# Patient Record
Sex: Female | Born: 1965 | Race: Black or African American | Hispanic: No | Smoking: Never smoker
Health system: Southern US, Community
[De-identification: ages and names within clinical notes are randomized; demographics above are authoritative.]

## PROBLEM LIST (undated history)

## (undated) DIAGNOSIS — R002 Palpitations: Secondary | ICD-10-CM

## (undated) DIAGNOSIS — N6019 Diffuse cystic mastopathy of unspecified breast: Secondary | ICD-10-CM

## (undated) DIAGNOSIS — R7301 Impaired fasting glucose: Secondary | ICD-10-CM

## (undated) DIAGNOSIS — R079 Chest pain, unspecified: Secondary | ICD-10-CM

## (undated) DIAGNOSIS — D72819 Decreased white blood cell count, unspecified: Secondary | ICD-10-CM

## (undated) DIAGNOSIS — E061 Subacute thyroiditis: Secondary | ICD-10-CM

## (undated) DIAGNOSIS — G609 Hereditary and idiopathic neuropathy, unspecified: Secondary | ICD-10-CM

## (undated) DIAGNOSIS — E0590 Thyrotoxicosis, unspecified without thyrotoxic crisis or storm: Secondary | ICD-10-CM

## (undated) DIAGNOSIS — H9319 Tinnitus, unspecified ear: Secondary | ICD-10-CM

## (undated) DIAGNOSIS — J309 Allergic rhinitis, unspecified: Secondary | ICD-10-CM

## (undated) DIAGNOSIS — N946 Dysmenorrhea, unspecified: Secondary | ICD-10-CM

## (undated) HISTORY — DX: Palpitations: R00.2

## (undated) HISTORY — PX: TUBAL LIGATION: SHX77

## (undated) HISTORY — DX: Impaired fasting glucose: R73.01

## (undated) HISTORY — DX: Hereditary and idiopathic neuropathy, unspecified: G60.9

## (undated) HISTORY — DX: Subacute thyroiditis: E06.1

## (undated) HISTORY — DX: Dysmenorrhea, unspecified: N94.6

## (undated) HISTORY — DX: Chest pain, unspecified: R07.9

## (undated) HISTORY — DX: Decreased white blood cell count, unspecified: D72.819

## (undated) HISTORY — DX: Thyrotoxicosis, unspecified without thyrotoxic crisis or storm: E05.90

## (undated) HISTORY — PX: TONSILLECTOMY: SUR1361

## (undated) HISTORY — DX: Diffuse cystic mastopathy of unspecified breast: N60.19

## (undated) HISTORY — DX: Tinnitus, unspecified ear: H93.19

## (undated) HISTORY — DX: Allergic rhinitis, unspecified: J30.9

---

## 1991-09-08 HISTORY — PX: BREAST EXCISIONAL BIOPSY: SUR124

## 2001-09-07 HISTORY — PX: BREAST EXCISIONAL BIOPSY: SUR124

## 2005-07-06 ENCOUNTER — Encounter (HOSPITAL_COMMUNITY): Admission: RE | Admit: 2005-07-06 | Discharge: 2005-10-04 | Payer: Self-pay | Admitting: Endocrinology

## 2006-03-31 ENCOUNTER — Emergency Department (HOSPITAL_COMMUNITY): Admission: EM | Admit: 2006-03-31 | Discharge: 2006-04-01 | Payer: Self-pay | Admitting: Emergency Medicine

## 2006-06-05 ENCOUNTER — Emergency Department (HOSPITAL_COMMUNITY): Admission: EM | Admit: 2006-06-05 | Discharge: 2006-06-06 | Payer: Self-pay | Admitting: Emergency Medicine

## 2006-06-07 ENCOUNTER — Ambulatory Visit (HOSPITAL_COMMUNITY): Admission: RE | Admit: 2006-06-07 | Discharge: 2006-06-07 | Payer: Self-pay | Admitting: Emergency Medicine

## 2006-06-07 ENCOUNTER — Encounter: Payer: Self-pay | Admitting: Vascular Surgery

## 2006-08-13 ENCOUNTER — Other Ambulatory Visit: Admission: RE | Admit: 2006-08-13 | Discharge: 2006-08-13 | Payer: Self-pay | Admitting: Obstetrics & Gynecology

## 2006-09-13 ENCOUNTER — Encounter: Admission: RE | Admit: 2006-09-13 | Discharge: 2006-09-13 | Payer: Self-pay | Admitting: Obstetrics & Gynecology

## 2007-06-21 ENCOUNTER — Encounter: Admission: RE | Admit: 2007-06-21 | Discharge: 2007-07-28 | Payer: Self-pay | Admitting: Family Medicine

## 2007-11-15 ENCOUNTER — Encounter: Admission: RE | Admit: 2007-11-15 | Discharge: 2007-11-15 | Payer: Self-pay | Admitting: Family Medicine

## 2008-03-13 ENCOUNTER — Emergency Department (HOSPITAL_COMMUNITY): Admission: EM | Admit: 2008-03-13 | Discharge: 2008-03-13 | Payer: Self-pay | Admitting: Emergency Medicine

## 2008-06-25 ENCOUNTER — Other Ambulatory Visit: Admission: RE | Admit: 2008-06-25 | Discharge: 2008-06-25 | Payer: Self-pay | Admitting: Obstetrics and Gynecology

## 2008-12-27 ENCOUNTER — Encounter: Payer: Self-pay | Admitting: Cardiology

## 2009-01-16 DIAGNOSIS — R079 Chest pain, unspecified: Secondary | ICD-10-CM | POA: Insufficient documentation

## 2009-01-17 ENCOUNTER — Encounter (INDEPENDENT_AMBULATORY_CARE_PROVIDER_SITE_OTHER): Payer: Self-pay | Admitting: *Deleted

## 2009-01-17 ENCOUNTER — Ambulatory Visit: Payer: Self-pay | Admitting: Cardiology

## 2009-01-18 ENCOUNTER — Ambulatory Visit: Payer: Self-pay | Admitting: Pulmonary Disease

## 2009-01-18 DIAGNOSIS — J309 Allergic rhinitis, unspecified: Secondary | ICD-10-CM | POA: Insufficient documentation

## 2009-10-11 ENCOUNTER — Encounter: Admission: RE | Admit: 2009-10-11 | Discharge: 2009-10-11 | Payer: Self-pay | Admitting: Obstetrics & Gynecology

## 2009-10-11 HISTORY — PX: BREAST BIOPSY: SHX20

## 2010-04-04 ENCOUNTER — Encounter: Admission: RE | Admit: 2010-04-04 | Discharge: 2010-04-04 | Payer: Self-pay | Admitting: Family Medicine

## 2010-04-07 ENCOUNTER — Encounter: Admission: RE | Admit: 2010-04-07 | Discharge: 2010-04-07 | Payer: Self-pay | Admitting: Internal Medicine

## 2010-08-04 ENCOUNTER — Encounter: Admission: RE | Admit: 2010-08-04 | Discharge: 2010-08-04 | Payer: Self-pay | Admitting: Obstetrics and Gynecology

## 2010-09-28 ENCOUNTER — Encounter: Payer: Self-pay | Admitting: Family Medicine

## 2011-04-09 ENCOUNTER — Other Ambulatory Visit: Payer: Self-pay | Admitting: Family Medicine

## 2011-04-09 DIAGNOSIS — R0789 Other chest pain: Secondary | ICD-10-CM

## 2011-04-13 ENCOUNTER — Ambulatory Visit
Admission: RE | Admit: 2011-04-13 | Discharge: 2011-04-13 | Disposition: A | Discharge status: Home / Self Care | Payer: 59 | Source: Ambulatory Visit | Attending: Family Medicine | Admitting: Family Medicine

## 2011-04-13 DIAGNOSIS — R0789 Other chest pain: Secondary | ICD-10-CM

## 2011-04-13 MED ORDER — IOHEXOL 300 MG/ML  SOLN
100.0000 mL | Freq: Once | INTRAMUSCULAR | Status: AC | PRN
  Administered 2011-04-13: 100 mL via INTRAVENOUS

## 2011-04-13 MED ORDER — DIPHENHYDRAMINE HCL 25 MG PO CAPS: 25.0000 mg | ORAL_CAPSULE | Freq: Once | ORAL | Status: DC

## 2011-04-13 NOTE — Progress Notes (Signed)
Suzanne Anderson is a 45 y.o. female patient. 1. Chest tightness   The patient gave a hx of mild urticarial reaction previously; and therefore she was given 25mg  of Benadryl po. No past medical history on file. No current outpatient prescriptions on file.   Current Facility-Administered Medications  Medication Dose Route Frequency Provider Last Rate Last Dose  . diphenhydrAMINE (BENADRYL) capsule 25 mg  25 mg Oral Once Juline Patch      . iohexol (OMNIPAQUE) 300 MG/ML injection 100 mL  100 mL Intravenous Once PRN Medication Radiologist   100 mL at 04/13/11 1023   Allergies not on file Active Problems:  * No active hospital problems. *   Last menstrual period 04/06/2011.  Subjective Objective Assessment & Plan  Juline Patch 04/13/2011

## 2011-04-14 ENCOUNTER — Ambulatory Visit (INDEPENDENT_AMBULATORY_CARE_PROVIDER_SITE_OTHER): Payer: 59 | Admitting: Internal Medicine

## 2011-04-14 DIAGNOSIS — R0609 Other forms of dyspnea: Secondary | ICD-10-CM

## 2011-04-14 LAB — PULMONARY FUNCTION TEST

## 2011-04-14 NOTE — Progress Notes (Signed)
PFT done today. 

## 2011-04-16 ENCOUNTER — Other Ambulatory Visit: Payer: Self-pay | Admitting: Internal Medicine

## 2011-04-22 ENCOUNTER — Encounter: Payer: Self-pay | Admitting: Family Medicine

## 2011-06-04 LAB — CBC
HCT: 39.1
Hemoglobin: 12.8
MCHC: 32.8
MCV: 81.5
Platelets: 231
RBC: 4.8
RDW: 14.4
WBC: 5.1

## 2011-06-04 LAB — COMPREHENSIVE METABOLIC PANEL
ALT: 18
AST: 25
Albumin: 4
Alkaline Phosphatase: 39
BUN: 8
CO2: 28
Calcium: 9.6
Chloride: 102
Creatinine, Ser: 0.7
GFR calc Af Amer: >60
GFR calc non Af Amer: >60
Glucose, Bld: 93
Potassium: 3.7
Sodium: 138
Total Bilirubin: 0.8
Total Protein: 7.3

## 2011-06-04 LAB — DIFFERENTIAL
Basophils Absolute: 0
Basophils Relative: 1
Eosinophils Absolute: 0.1
Eosinophils Relative: 2
Lymphocytes Relative: 35
Lymphs Abs: 1.8
Monocytes Absolute: 0.4
Monocytes Relative: 7
Neutro Abs: 2.8
Neutrophils Relative %: 55

## 2011-06-04 LAB — URINALYSIS, ROUTINE W REFLEX MICROSCOPIC
Glucose, UA: NEGATIVE
Protein, ur: NEGATIVE
Specific Gravity, Urine: 1.006
Urobilinogen, UA: 0.2

## 2011-06-04 LAB — POCT CARDIAC MARKERS
CKMB, poc: 1.8
Myoglobin, poc: 57.5
Operator id: 280141
Troponin i, poc: <0.05

## 2011-06-04 LAB — D-DIMER, QUANTITATIVE: D-Dimer, Quant: 0.36

## 2011-06-04 LAB — LIPASE, BLOOD: Lipase: 18

## 2011-08-25 ENCOUNTER — Other Ambulatory Visit: Payer: Self-pay | Admitting: Family Medicine

## 2011-08-25 DIAGNOSIS — Z1231 Encounter for screening mammogram for malignant neoplasm of breast: Secondary | ICD-10-CM

## 2011-09-18 ENCOUNTER — Ambulatory Visit: Payer: 59

## 2011-10-15 ENCOUNTER — Ambulatory Visit: Payer: 59

## 2011-11-13 ENCOUNTER — Ambulatory Visit
Admission: RE | Admit: 2011-11-13 | Discharge: 2011-11-13 | Disposition: A | Discharge status: Home / Self Care | Payer: 59 | Source: Ambulatory Visit | Attending: Family Medicine | Admitting: Family Medicine

## 2011-11-13 DIAGNOSIS — Z1231 Encounter for screening mammogram for malignant neoplasm of breast: Secondary | ICD-10-CM

## 2012-01-04 ENCOUNTER — Ambulatory Visit: Payer: 59 | Admitting: Nurse Practitioner

## 2012-02-17 ENCOUNTER — Encounter: Payer: Self-pay | Admitting: *Deleted

## 2012-02-29 ENCOUNTER — Encounter: Payer: 59 | Admitting: Cardiology

## 2012-10-27 ENCOUNTER — Other Ambulatory Visit (HOSPITAL_COMMUNITY): Payer: Self-pay | Admitting: Endocrinology

## 2012-10-27 DIAGNOSIS — E0590 Thyrotoxicosis, unspecified without thyrotoxic crisis or storm: Secondary | ICD-10-CM

## 2012-11-08 ENCOUNTER — Encounter (HOSPITAL_COMMUNITY)
Admission: RE | Admit: 2012-11-08 | Discharge: 2012-11-08 | Disposition: A | Discharge status: Home / Self Care | Payer: 59 | Source: Ambulatory Visit | Attending: Endocrinology | Admitting: Endocrinology

## 2012-11-08 DIAGNOSIS — E0590 Thyrotoxicosis, unspecified without thyrotoxic crisis or storm: Secondary | ICD-10-CM | POA: Insufficient documentation

## 2012-11-09 ENCOUNTER — Encounter (HOSPITAL_COMMUNITY): Payer: Self-pay

## 2012-11-09 ENCOUNTER — Encounter (HOSPITAL_COMMUNITY)
Admission: RE | Admit: 2012-11-09 | Discharge: 2012-11-09 | Disposition: A | Discharge status: Home / Self Care | Payer: 59 | Source: Ambulatory Visit | Attending: Endocrinology | Admitting: Endocrinology

## 2012-11-09 MED ORDER — SODIUM IODIDE I 131 CAPSULE
10.3000 | Freq: Once | INTRAVENOUS | Status: AC | PRN
  Administered 2012-11-09: 10.3 via ORAL

## 2012-11-09 MED ORDER — SODIUM PERTECHNETATE TC 99M INJECTION
11.0000 | Freq: Once | INTRAVENOUS | Status: AC | PRN
  Administered 2012-11-09: 11 via INTRAVENOUS

## 2013-07-14 ENCOUNTER — Other Ambulatory Visit: Payer: Self-pay

## 2013-07-14 DIAGNOSIS — Z1231 Encounter for screening mammogram for malignant neoplasm of breast: Secondary | ICD-10-CM

## 2013-08-07 ENCOUNTER — Other Ambulatory Visit (HOSPITAL_COMMUNITY)
Admission: RE | Admit: 2013-08-07 | Discharge: 2013-08-07 | Disposition: A | Discharge status: Home / Self Care | Payer: 59 | Source: Ambulatory Visit | Attending: Family Medicine | Admitting: Family Medicine

## 2013-08-07 ENCOUNTER — Other Ambulatory Visit: Payer: Self-pay | Admitting: Family Medicine

## 2013-08-07 DIAGNOSIS — Z01419 Encounter for gynecological examination (general) (routine) without abnormal findings: Secondary | ICD-10-CM | POA: Insufficient documentation

## 2013-08-15 ENCOUNTER — Ambulatory Visit: Payer: 59

## 2013-08-30 ENCOUNTER — Ambulatory Visit
Admission: RE | Admit: 2013-08-30 | Discharge: 2013-08-30 | Disposition: A | Discharge status: Home / Self Care | Payer: 59 | Source: Ambulatory Visit

## 2013-08-30 DIAGNOSIS — Z1231 Encounter for screening mammogram for malignant neoplasm of breast: Secondary | ICD-10-CM

## 2013-11-13 ENCOUNTER — Other Ambulatory Visit: Payer: Self-pay | Admitting: Family Medicine

## 2013-11-13 DIAGNOSIS — M549 Dorsalgia, unspecified: Secondary | ICD-10-CM

## 2013-11-14 ENCOUNTER — Ambulatory Visit
Admission: RE | Admit: 2013-11-14 | Discharge: 2013-11-14 | Disposition: A | Discharge status: Home / Self Care | Payer: 59 | Source: Ambulatory Visit | Attending: Family Medicine | Admitting: Family Medicine

## 2013-11-14 DIAGNOSIS — M549 Dorsalgia, unspecified: Secondary | ICD-10-CM

## 2013-11-17 ENCOUNTER — Emergency Department (HOSPITAL_COMMUNITY)
Admission: EM | Admit: 2013-11-17 | Discharge: 2013-11-17 | Disposition: A | Discharge status: Home / Self Care | Payer: 59 | Attending: Emergency Medicine | Admitting: Emergency Medicine

## 2013-11-17 ENCOUNTER — Encounter (HOSPITAL_COMMUNITY): Payer: Self-pay | Admitting: Emergency Medicine

## 2013-11-17 DIAGNOSIS — Z79899 Other long term (current) drug therapy: Secondary | ICD-10-CM | POA: Insufficient documentation

## 2013-11-17 DIAGNOSIS — Z862 Personal history of diseases of the blood and blood-forming organs and certain disorders involving the immune mechanism: Secondary | ICD-10-CM | POA: Insufficient documentation

## 2013-11-17 DIAGNOSIS — M545 Low back pain, unspecified: Secondary | ICD-10-CM

## 2013-11-17 DIAGNOSIS — M4310 Spondylolisthesis, site unspecified: Secondary | ICD-10-CM | POA: Insufficient documentation

## 2013-11-17 DIAGNOSIS — Z8742 Personal history of other diseases of the female genital tract: Secondary | ICD-10-CM | POA: Insufficient documentation

## 2013-11-17 DIAGNOSIS — Z8639 Personal history of other endocrine, nutritional and metabolic disease: Secondary | ICD-10-CM | POA: Insufficient documentation

## 2013-11-17 DIAGNOSIS — Z8669 Personal history of other diseases of the nervous system and sense organs: Secondary | ICD-10-CM | POA: Insufficient documentation

## 2013-11-17 LAB — CBC WITH DIFFERENTIAL/PLATELET
BASOS PCT: 0 % (ref 0–1)
Basophils Absolute: 0 10*3/uL (ref 0.0–0.1)
EOS ABS: 0.1 10*3/uL (ref 0.0–0.7)
EOS PCT: 1 % (ref 0–5)
HCT: 41.8 % (ref 36.0–46.0)
HEMOGLOBIN: 14 g/dL (ref 12.0–15.0)
Lymphocytes Relative: 9 % — ABNORMAL LOW (ref 12–46)
Lymphs Abs: 0.7 10*3/uL (ref 0.7–4.0)
MCH: 27 pg (ref 26.0–34.0)
MCHC: 33.5 g/dL (ref 30.0–36.0)
MCV: 80.7 fL (ref 78.0–100.0)
MONO ABS: 1.1 10*3/uL — AB (ref 0.1–1.0)
MONOS PCT: 14 % — AB (ref 3–12)
NEUTROS PCT: 77 % (ref 43–77)
Neutro Abs: 6.4 10*3/uL (ref 1.7–7.7)
Platelets: 236 10*3/uL (ref 150–400)
RBC: 5.18 MIL/uL — ABNORMAL HIGH (ref 3.87–5.11)
RDW: 15 % (ref 11.5–15.5)
WBC: 8.3 10*3/uL (ref 4.0–10.5)

## 2013-11-17 LAB — BASIC METABOLIC PANEL
BUN: 14 mg/dL (ref 6–23)
CALCIUM: 9.8 mg/dL (ref 8.4–10.5)
CO2: 23 mEq/L (ref 19–32)
CREATININE: 0.88 mg/dL (ref 0.50–1.10)
Chloride: 99 mEq/L (ref 96–112)
GFR, EST AFRICAN AMERICAN: 90 mL/min — AB (ref >90)
GFR, EST NON AFRICAN AMERICAN: 78 mL/min — AB (ref >90)
GLUCOSE: 112 mg/dL — AB (ref 70–99)
POTASSIUM: 4.5 meq/L (ref 3.7–5.3)
Sodium: 136 mEq/L — ABNORMAL LOW (ref 137–147)

## 2013-11-17 MED ORDER — ONDANSETRON 8 MG PO TBDP
8.0000 mg | ORAL_TABLET | Freq: Once | ORAL | Status: AC
  Administered 2013-11-17: 8 mg via ORAL
  Filled 2013-11-17: qty 1

## 2013-11-17 MED ORDER — HYDROMORPHONE HCL PF 1 MG/ML IJ SOLN
1.0000 mg | Freq: Once | INTRAMUSCULAR | Status: AC
  Administered 2013-11-17: 1 mg via INTRAVENOUS
  Filled 2013-11-17: qty 1

## 2013-11-17 MED ORDER — KETOROLAC TROMETHAMINE 15 MG/ML IJ SOLN
30.0000 mg | Freq: Once | INTRAMUSCULAR | Status: AC
  Administered 2013-11-17: 30 mg via INTRAVENOUS
  Filled 2013-11-17: qty 2

## 2013-11-17 MED ORDER — HYDROMORPHONE HCL 2 MG PO TABS
2.0000 mg | ORAL_TABLET | Freq: Once | ORAL | Status: AC
  Administered 2013-11-17: 2 mg via ORAL
  Filled 2013-11-17: qty 1

## 2013-11-17 MED ORDER — SODIUM CHLORIDE 0.9 % IV BOLUS (SEPSIS)
1000.0000 mL | Freq: Once | INTRAVENOUS | Status: AC
  Administered 2013-11-17: 1000 mL via INTRAVENOUS

## 2013-11-17 NOTE — ED Provider Notes (Signed)
CSN: [id number]     Arrival date & time 11/17/13  0212 History   First MD Initiated Contact with Patient 11/17/13 0421     Chief Complaint  Patient presents with  . Back Pain     (Consider location/radiation/quality/duration/timing/severity/associated sxs/prior Treatment) HPI Comments: Pt comes in with cc of back pain. Pain started in Jan, and she had a MRI - that showed lumbar disk protrusion. Pt started having flare up of her pain, which is left sided, and sharp again last night. Pain is worse with any movement. No associated numbness, weakness, urinary incontinence, urinary retention, bowel incontinence, saddle anesthesia.    Patient is a 48 y.o. female presenting with back pain. The history is provided by the patient.  Back Pain Associated symptoms: no fever, no numbness and no weakness     Past Medical History  Diagnosis Date  . Allergic rhinitis, cause unspecified   . Chest pain, unspecified   . Hyperthyroidism     reported as borderline by Dr. Vashti Hey  . Tinnitus   . Fibrocystic breast    Past Surgical History  Procedure Laterality Date  . Cesarean section    . Breast biopsy      for fibrocystic breast  . Tonsillectomy     History reviewed. No pertinent family history. History  Substance Use Topics  . Smoking status: Never Smoker   . Smokeless tobacco: Never Used  . Alcohol Use: No   OB History   Grav Para Term Preterm Abortions TAB SAB Ect Mult Living                 Review of Systems  Constitutional: Negative for fever and chills.  Gastrointestinal: Negative for nausea and vomiting.  Genitourinary: Negative for frequency, hematuria and flank pain.  Musculoskeletal: Positive for back pain.  Neurological: Negative for weakness and numbness.      Allergies  Terazol and Monistat  Home Medications   Current Outpatient Rx  Name  Route  Sig  Dispense  Refill  . albuterol (PROVENTIL HFA;VENTOLIN HFA) 108 (90 BASE) MCG/ACT inhaler   Inhalation  Inhale 1 puff into the lungs every 6 (six) hours as needed for wheezing or shortness of breath.         . cyclobenzaprine (FLEXERIL) 10 MG tablet   Oral   Take 10 mg by mouth 3 (three) times daily as needed for muscle spasms.         Marland Kitchen HYDROcodone-acetaminophen (NORCO/VICODIN) 5-325 MG per tablet   Oral   Take 1 tablet by mouth every 6 (six) hours as needed for moderate pain.         . montelukast (SINGULAIR) 10 MG tablet   Oral   Take 10 mg by mouth at bedtime.          BP 113/66  Pulse 87  Temp(Src) 98.9 F (37.2 C) (Oral)  Resp 18  Ht 5\' 4"  (1.626 m)  Wt 159 lb (72.122 kg)  BMI 27.28 kg/m2  SpO2 98%  LMP 11/01/2013 Physical Exam  Nursing note and vitals reviewed. Constitutional: She is oriented to person, place, and time. She appears well-developed and well-nourished.  HENT:  Head: Normocephalic and atraumatic.  Eyes: EOM are normal. Pupils are equal, round, and reactive to light.  Neck: Neck supple.  Cardiovascular: Normal rate, regular rhythm and normal heart sounds.   No murmur heard. Pulmonary/Chest: Effort normal. No respiratory distress.  Abdominal: Soft. She exhibits no distension. There is no tenderness. There is no rebound  and no guarding.  Musculoskeletal:  Pt has tenderness over the lumbar region No step offs, no erythema. Pt has 2+ patellar reflex bilaterally. Able to discriminate between sharp and dull.    Neurological: She is alert and oriented to person, place, and time.  Skin: Skin is warm and dry.    ED Course  Procedures (including critical care time) Labs Review Labs Reviewed  CBC WITH DIFFERENTIAL - Abnormal; Notable for the following:    RBC 5.18 (*)    Lymphocytes Relative 9 (*)    Monocytes Relative 14 (*)    Monocytes Absolute 1.1 (*)    All other components within normal limits  BASIC METABOLIC PANEL - Abnormal; Notable for the following:    Sodium 136 (*)    Glucose, Bld 112 (*)    GFR calc non Af Amer 78 (*)    GFR  calc Af Amer 90 (*)    All other components within normal limits  URINALYSIS, ROUTINE W REFLEX MICROSCOPIC  POC URINE PREG, ED   Imaging Review No results found.   EKG Interpretation None      MDM   Final diagnoses:  Lumbar spine pain  Spondylisthesis    Pt comes in with back pain. MRI done just 2-3 days ago, has some spine disc disease. Has Neurosurgery appt today. No cord compression issues. Pain improved with iv pain meds. Pt able to stand up and ambulate  -still uncomfortable. We decided to send her home, so that she Lucianne Lei get that important Nsurgery appt. Has pain meds at home. Return precautions discussed.   Varney Biles, MD 11/17/13 [phone]

## 2013-11-17 NOTE — Discharge Instructions (Signed)
Back Pain, Adult Low back pain is very common. About 1 in 5 people have back pain.The cause of low back pain is rarely dangerous. The pain often gets better over time.About half of people with a sudden onset of back pain feel better in just 2 weeks. About 8 in 10 people feel better by 6 weeks.  CAUSES Some common causes of back pain include:  Strain of the muscles or ligaments supporting the spine.  Wear and tear (degeneration) of the spinal discs.  Arthritis.  Direct injury to the back. DIAGNOSIS Most of the time, the direct cause of low back pain is not known.However, back pain can be treated effectively even when the exact cause of the pain is unknown.Answering your caregiver's questions about your overall health and symptoms is one of the most accurate ways to make sure the cause of your pain is not dangerous. If your caregiver needs more information, he or she may order lab work or imaging tests (X-rays or MRIs).However, even if imaging tests show changes in your back, this usually does not require surgery. HOME CARE INSTRUCTIONS For many people, back pain returns.Since low back pain is rarely dangerous, it is often a condition that people can learn to manageon their own.   Remain active. It is stressful on the back to sit or stand in one place. Do not sit, drive, or stand in one place for more than 30 minutes at a time. Take short walks on level surfaces as soon as pain allows.Try to increase the length of time you walk each day.  Do not stay in bed.Resting more than 1 or 2 days can delay your recovery.  Do not avoid exercise or work.Your body is made to move.It is not dangerous to be active, even though your back may hurt.Your back will likely heal faster if you return to being active before your pain is gone.  Pay attention to your body when you bend and lift. Many people have less discomfortwhen lifting if they bend their knees, keep the load close to their bodies,and  avoid twisting. Often, the most comfortable positions are those that put less stress on your recovering back.  Find a comfortable position to sleep. Use a firm mattress and lie on your side with your knees slightly bent. If you lie on your back, put a pillow under your knees.  Only take over-the-counter or prescription medicines as directed by your caregiver. Over-the-counter medicines to reduce pain and inflammation are often the most helpful.Your caregiver may prescribe muscle relaxant drugs.These medicines help dull your pain so you can more quickly return to your normal activities and healthy exercise.  Put ice on the injured area.  Put ice in a plastic bag.  Place a towel between your skin and the bag.  Leave the ice on for 15-20 minutes, 03-04 times a day for the first 2 to 3 days. After that, ice and heat may be alternated to reduce pain and spasms.  Ask your caregiver about trying back exercises and gentle massage. This may be of some benefit.  Avoid feeling anxious or stressed.Stress increases muscle tension and can worsen back pain.It is important to recognize when you are anxious or stressed and learn ways to manage it.Exercise is a great option. SEEK MEDICAL CARE IF:  You have pain that is not relieved with rest or medicine.  You have pain that does not improve in 1 week.  You have new symptoms.  You are generally not feeling well. SEEK   IMMEDIATE MEDICAL CARE IF:   You have pain that radiates from your back into your legs.  You develop new bowel or bladder control problems.  You have unusual weakness or numbness in your arms or legs.  You develop nausea or vomiting.  You develop abdominal pain.  You feel faint. Document Released: 08/24/2005 Document Revised: 02/23/2012 Document Reviewed: 01/12/2011 [employer] Patient Information 2014 [employer].  

## 2013-11-17 NOTE — ED Notes (Signed)
Patient states that she took only one dose of the flexeril. It was explained that she can take it 3 times daily as needed. Recently prescribed medications have not been taken consistently.

## 2013-11-17 NOTE — ED Notes (Signed)
Pt reports that she had worked out in January and began having lower back pain, pt had an MRI yesterday and was given medications for pain. Pt states that the medication has not helped, that the pain has caused her to be nauseated and faint earlier today.

## 2013-11-18 ENCOUNTER — Emergency Department (HOSPITAL_COMMUNITY): Payer: 59

## 2013-11-18 ENCOUNTER — Encounter (HOSPITAL_COMMUNITY): Payer: Self-pay | Admitting: Emergency Medicine

## 2013-11-18 ENCOUNTER — Emergency Department (HOSPITAL_COMMUNITY)
Admission: EM | Admit: 2013-11-18 | Discharge: 2013-11-18 | Disposition: A | Discharge status: Home / Self Care | Payer: 59 | Attending: Emergency Medicine | Admitting: Emergency Medicine

## 2013-11-18 DIAGNOSIS — R1031 Right lower quadrant pain: Secondary | ICD-10-CM | POA: Insufficient documentation

## 2013-11-18 DIAGNOSIS — Z8639 Personal history of other endocrine, nutritional and metabolic disease: Secondary | ICD-10-CM | POA: Insufficient documentation

## 2013-11-18 DIAGNOSIS — R110 Nausea: Secondary | ICD-10-CM | POA: Insufficient documentation

## 2013-11-18 DIAGNOSIS — Z8709 Personal history of other diseases of the respiratory system: Secondary | ICD-10-CM | POA: Insufficient documentation

## 2013-11-18 DIAGNOSIS — Z862 Personal history of diseases of the blood and blood-forming organs and certain disorders involving the immune mechanism: Secondary | ICD-10-CM | POA: Insufficient documentation

## 2013-11-18 DIAGNOSIS — M545 Low back pain, unspecified: Secondary | ICD-10-CM | POA: Insufficient documentation

## 2013-11-18 DIAGNOSIS — Z8669 Personal history of other diseases of the nervous system and sense organs: Secondary | ICD-10-CM | POA: Insufficient documentation

## 2013-11-18 DIAGNOSIS — R109 Unspecified abdominal pain: Secondary | ICD-10-CM

## 2013-11-18 DIAGNOSIS — R1032 Left lower quadrant pain: Secondary | ICD-10-CM | POA: Insufficient documentation

## 2013-11-18 DIAGNOSIS — Z8742 Personal history of other diseases of the female genital tract: Secondary | ICD-10-CM | POA: Insufficient documentation

## 2013-11-18 LAB — CBC WITH DIFFERENTIAL/PLATELET
BASOS ABS: 0 10*3/uL (ref 0.0–0.1)
BASOS PCT: 0 % (ref 0–1)
EOS ABS: 0.1 10*3/uL (ref 0.0–0.7)
Eosinophils Relative: 2 % (ref 0–5)
HCT: 40.6 % (ref 36.0–46.0)
Hemoglobin: 13.8 g/dL (ref 12.0–15.0)
Lymphocytes Relative: 20 % (ref 12–46)
Lymphs Abs: 1.4 10*3/uL (ref 0.7–4.0)
MCH: 27.4 pg (ref 26.0–34.0)
MCHC: 34 g/dL (ref 30.0–36.0)
MCV: 80.6 fL (ref 78.0–100.0)
Monocytes Absolute: 0.7 10*3/uL (ref 0.1–1.0)
Monocytes Relative: 10 % (ref 3–12)
NEUTROS ABS: 4.6 10*3/uL (ref 1.7–7.7)
NEUTROS PCT: 68 % (ref 43–77)
PLATELETS: 224 10*3/uL (ref 150–400)
RBC: 5.04 MIL/uL (ref 3.87–5.11)
RDW: 15.2 % (ref 11.5–15.5)
WBC: 6.8 10*3/uL (ref 4.0–10.5)

## 2013-11-18 LAB — WET PREP, GENITAL
CLUE CELLS WET PREP: NONE SEEN
TRICH WET PREP: NONE SEEN
Yeast Wet Prep HPF POC: NONE SEEN

## 2013-11-18 MED ORDER — HYDROCODONE-ACETAMINOPHEN 5-325 MG PO TABS: 1.0000 | ORAL_TABLET | Freq: Four times a day (QID) | ORAL | Status: DC | PRN

## 2013-11-18 MED ORDER — SODIUM CHLORIDE 0.9 % IV SOLN
Freq: Once | INTRAVENOUS | Status: AC
Start: 2013-11-18 — End: 2013-11-18
  Administered 2013-11-18: 10 mL/h via INTRAVENOUS

## 2013-11-18 MED ORDER — MORPHINE SULFATE 4 MG/ML IJ SOLN
4.0000 mg | Freq: Once | INTRAMUSCULAR | Status: AC
  Administered 2013-11-18: 4 mg via INTRAVENOUS
  Filled 2013-11-18: qty 1

## 2013-11-18 NOTE — ED Notes (Signed)
Pt comes from home where she lives with family with c/o lower abdominal pain radiating to flanks and low back.  Pt has been seen by PCP and orthopedist and had an MRI and xrays done.  None revealed cause of pain.  Pt endorses nausea, but denies vomiting and diarrhea.  Pt's last BM was Thursday past.  Pt's abdomen is mildly distended and not tender to palpation.  Pt referred here from [facility] walk-in clinic for abd/pelvic CT.

## 2013-11-18 NOTE — Discharge Instructions (Signed)
Abdominal Pain, Women °Abdominal (stomach, pelvic, or belly) pain can be caused by many things. It is important to tell your doctor: °· The location of the pain. °· Does it come and go or is it present all the time? °· Are there things that start the pain (eating certain foods, exercise)? °· Are there other symptoms associated with the pain (fever, nausea, vomiting, diarrhea)? °All of this is helpful to know when trying to find the cause of the pain. °CAUSES  °· Stomach: virus or bacteria infection, or ulcer. °· Intestine: appendicitis (inflamed appendix), regional ileitis (Crohn's disease), ulcerative colitis (inflamed colon), irritable bowel syndrome, diverticulitis (inflamed diverticulum of the colon), or cancer of the stomach or intestine. °· Gallbladder disease or stones in the gallbladder. °· Kidney disease, kidney stones, or infection. °· Pancreas infection or cancer. °· Fibromyalgia (pain disorder). °· Diseases of the female organs: °· Uterus: fibroid (non-cancerous) tumors or infection. °· Fallopian tubes: infection or tubal pregnancy. °· Ovary: cysts or tumors. °· Pelvic adhesions (scar tissue). °· Endometriosis (uterus lining tissue growing in the pelvis and on the pelvic organs). °· Pelvic congestion syndrome (female organs filling up with blood just before the menstrual period). °· Pain with the menstrual period. °· Pain with ovulation (producing an egg). °· Pain with an IUD (intrauterine device, birth control) in the uterus. °· Cancer of the female organs. °· Functional pain (pain not caused by a disease, may improve without treatment). °· Psychological pain. °· Depression. °DIAGNOSIS  °Your doctor will decide the seriousness of your pain by doing an examination. °· Blood tests. °· X-rays. °· Ultrasound. °· CT scan (computed tomography, special type of X-ray). °· MRI (magnetic resonance imaging). °· Cultures, for infection. °· Barium enema (dye inserted in the large intestine, to better view it with  X-rays). °· Colonoscopy (looking in intestine with a lighted tube). °· Laparoscopy (minor surgery, looking in abdomen with a lighted tube). °· Major abdominal exploratory surgery (looking in abdomen with a large incision). °TREATMENT  °The treatment will depend on the cause of the pain.  °· Many cases can be observed and treated at home. °· Over-the-counter medicines recommended by your caregiver. °· Prescription medicine. °· Antibiotics, for infection. °· Birth control pills, for painful periods or for ovulation pain. °· Hormone treatment, for endometriosis. °· Nerve blocking injections. °· Physical therapy. °· Antidepressants. °· Counseling with a psychologist or psychiatrist. °· Minor or major surgery. °HOME CARE INSTRUCTIONS  °· Do not take laxatives, unless directed by your caregiver. °· Take over-the-counter pain medicine only if ordered by your caregiver. Do not take aspirin because it can cause an upset stomach or bleeding. °· Try a clear liquid diet (broth or water) as ordered by your caregiver. Slowly move to a bland diet, as tolerated, if the pain is related to the stomach or intestine. °· Have a thermometer and take your temperature several times a day, and record it. °· Bed rest and sleep, if it helps the pain. °· Avoid sexual intercourse, if it causes pain. °· Avoid stressful situations. °· Keep your follow-up appointments and tests, as your caregiver orders. °· If the pain does not go away with medicine or surgery, you may try: °· Acupuncture. °· Relaxation exercises (yoga, meditation). °· Group therapy. °· Counseling. °SEEK MEDICAL CARE IF:  °· You notice certain foods cause stomach pain. °· Your home care treatment is not helping your pain. °· You need stronger pain medicine. °· You want your IUD removed. °· You feel faint or   lightheaded. °· You develop nausea and vomiting. °· You develop a rash. °· You are having side effects or an allergy to your medicine. °SEEK IMMEDIATE MEDICAL CARE IF:  °· Your  pain does not go away or gets worse. °· You have a fever. °· Your pain is felt only in portions of the abdomen. The right side could possibly be appendicitis. The left lower portion of the abdomen could be colitis or diverticulitis. °· You are passing blood in your stools (bright red or black tarry stools, with or without vomiting). °· You have blood in your urine. °· You develop chills, with or without a fever. °· You pass out. °MAKE SURE YOU:  °· Understand these instructions. °· Will watch your condition. °· Will get help right away if you are not doing well or get worse. °Document Released: 06/21/2007 Document Revised: 11/16/2011 Document Reviewed: 07/11/2009 °[employer]® Patient Information ©2014 [employer]. ° °

## 2013-11-18 NOTE — ED Provider Notes (Signed)
CSN: [id number]     Arrival date & time 11/18/13  1116 History   First MD Initiated Contact with Patient 11/18/13 1121     Chief Complaint  Patient presents with  . Abdominal Pain  . Flank Pain     (Consider location/radiation/quality/duration/timing/severity/associated sxs/prior Treatment) Patient is a 48 y.o. female presenting with abdominal pain and flank pain. The history is provided by the patient. No language interpreter was used.  Abdominal Pain Pain location:  LLQ and RLQ Associated symptoms: nausea   Associated symptoms: no chest pain, no constipation, no dysuria, no fever, no shortness of breath, no vaginal bleeding, no vaginal discharge and no vomiting   Associated symptoms comment:  She returns to the ED after being seen yesterday for lower back pain. She reports this is an ongoing issue that has been evaluated with MRI and orthopedic consult that ruled out a radicular cause of pain. She states the pain in the lower left back radiates around to the abdomen and across to the right side and is more intense today that previously. She has had nausea without vomiting. She reports regular and daily bowel movements. No urinary symptoms, vaginal discharge. She has a normal PAP in January of this year.  Flank Pain Associated symptoms include abdominal pain and nausea. Pertinent negatives include no chest pain, fever or vomiting.    Past Medical History  Diagnosis Date  . Allergic rhinitis, cause unspecified   . Chest pain, unspecified   . Hyperthyroidism     reported as borderline by Dr. Vashti Hey  . Tinnitus   . Fibrocystic breast    Past Surgical History  Procedure Laterality Date  . Cesarean section    . Breast biopsy      for fibrocystic breast  . Tonsillectomy     No family history on file. History  Substance Use Topics  . Smoking status: Never Smoker   . Smokeless tobacco: Never Used  . Alcohol Use: No   OB History   Grav Para Term Preterm Abortions TAB SAB  Ect Mult Living                 Review of Systems  Constitutional: Negative for fever.  Respiratory: Negative for shortness of breath.   Cardiovascular: Negative for chest pain.  Gastrointestinal: Positive for nausea and abdominal pain. Negative for vomiting and constipation.  Genitourinary: Positive for flank pain. Negative for dysuria, vaginal bleeding and vaginal discharge.  Musculoskeletal: Positive for back pain.      Allergies  Peanuts; Terazol; and Monistat  Home Medications   Current Outpatient Rx  Name  Route  Sig  Dispense  Refill  . albuterol (PROVENTIL HFA;VENTOLIN HFA) 108 (90 BASE) MCG/ACT inhaler   Inhalation   Inhale 1 puff into the lungs every 6 (six) hours as needed for wheezing or shortness of breath.         . cyclobenzaprine (FLEXERIL) 10 MG tablet   Oral   Take 10 mg by mouth 3 (three) times daily as needed for muscle spasms.         Marland Kitchen HYDROcodone-acetaminophen (NORCO/VICODIN) 5-325 MG per tablet   Oral   Take 1 tablet by mouth every 6 (six) hours as needed for moderate pain.         . montelukast (SINGULAIR) 10 MG tablet   Oral   Take 10 mg by mouth at bedtime.          BP 114/74  Pulse 100  Temp(Src) 98.3 F (36.8  C) (Oral)  Resp 12  SpO2 97%  LMP 10/29/2013 Physical Exam  Constitutional: She is oriented to person, place, and time. She appears well-developed and well-nourished.  HENT:  Head: Normocephalic.  Neck: Normal range of motion. Neck supple.  Cardiovascular: Normal rate and regular rhythm.   Pulmonary/Chest: Effort normal and breath sounds normal.  Abdominal: Soft. Bowel sounds are normal. There is tenderness. There is no rebound and no guarding.  Tender across lower abdomen without guarding.   Genitourinary: No vaginal discharge found.  No adnexal tenderness or mass. No CMT.  Musculoskeletal: Normal range of motion.  Neurological: She is alert and oriented to person, place, and time.  Skin: Skin is warm and dry. No  rash noted.  Psychiatric: She has a normal mood and affect.    ED Course  Procedures (including critical care time) Labs Review Labs Reviewed  WET PREP, GENITAL  GC/CHLAMYDIA PROBE AMP  CBC WITH DIFFERENTIAL   Imaging Review Ct Abdomen Pelvis W Contrast  11/18/2013   CLINICAL DATA:  Abdominal pain and flank pain.  EXAM: CT ABDOMEN AND PELVIS WITHOUT CONTRAST  TECHNIQUE: Multidetector CT imaging of the abdomen and pelvis was performed without intravenous contrast.  CONTRAST:  None  COMPARISON:  MRI lumbar spine 11/14/2013  FINDINGS: The imaged lung bases are clear. Slight elevation the left hemidiaphragm. Both kidneys are normal in size and contour. Incomplete rotation of the left kidney, an anatomic variant. Negative for renal stones or hydronephrosis. No evidence of ureteral dilatation. No ureteral stones are identified. There are bilateral pelvic phleboliths. The urinary bladder has a normal appearance. The noncontrast appearance of the liver, gallbladder, spleen, adrenal glands, and pancreas is normal. The stomach is decompressed. Small bowel loops are normal in caliber. There is a moderate amount of stool in the colon. No evidence of colonic wall thickening. No mesenteric inflammatory changes are identified.  No lymphadenopathy is detected on noncontrast imaging. Negative for ascites or free intraperitoneal air. Uterus is retroflexed. No adnexal mass identified. No acute or suspicious bony abnormality.  IMPRESSION: 1. Negative for urinary tract stone disease or obstruction. 2. Moderate amount of stool in the colon. 3. Retroflexed uterus.   Electronically Signed   By: Curlene Dolphin M.D.   On: 11/18/2013 13:06     EKG Interpretation None      MDM   Final diagnoses:  None    1. Abdominal pain  Patient with persistent, worsening abdominal pain predominantly in LLQ, with negative CT scan, non-tender pelvic exam, no leukocytosis with symptoms for 2 weeks. She is stable for discharge and  further work up outpatient. Return precautions given.     Dewaine Oats, PA-C 11/18/13 1453

## 2013-11-18 NOTE — ED Provider Notes (Signed)
Medical screening examination/treatment/procedure(s) were performed by non-physician practitioner and as supervising physician I was immediately available for consultation/collaboration.  Kathalene Frames, MD 11/18/13 1455

## 2013-11-18 NOTE — ED Notes (Signed)
Pt ambulated to bathroom 

## 2013-11-18 NOTE — ED Notes (Signed)
Patient transported to and returned from CT.

## 2013-11-18 NOTE — ED Notes (Signed)
Pt's bowels sounds are diminished - pt states she has not been eating because she has been so busy going to various medical practices.  She denies anorexia.  Pt's abdomen is mildly distended, but not painful to palpation.  Pt states pain increases with movement.  Pt has good peripheral pulses and lung sounds are clear.  Pt denies vomiting, but endorses nausea.  Pt denies pain with urination or hematuria, but endorses moderate increase in urination.  Pt's last BM was Thursday and pt denies blood in stool or exceptionally dark stool.

## 2013-11-20 LAB — GC/CHLAMYDIA PROBE AMP
CT Probe RNA: NEGATIVE
GC PROBE AMP APTIMA: NEGATIVE

## 2013-11-22 ENCOUNTER — Other Ambulatory Visit: Payer: Self-pay | Admitting: Gastroenterology

## 2013-11-22 DIAGNOSIS — R1032 Left lower quadrant pain: Secondary | ICD-10-CM

## 2013-11-29 ENCOUNTER — Ambulatory Visit
Admission: RE | Admit: 2013-11-29 | Discharge: 2013-11-29 | Disposition: A | Discharge status: Home / Self Care | Payer: 59 | Source: Ambulatory Visit | Attending: Gastroenterology | Admitting: Gastroenterology

## 2013-11-29 DIAGNOSIS — R1032 Left lower quadrant pain: Secondary | ICD-10-CM

## 2014-01-22 ENCOUNTER — Encounter: Payer: Self-pay | Admitting: Certified Nurse Midwife

## 2014-02-20 ENCOUNTER — Encounter: Payer: Self-pay | Admitting: Certified Nurse Midwife

## 2014-02-20 ENCOUNTER — Ambulatory Visit (INDEPENDENT_AMBULATORY_CARE_PROVIDER_SITE_OTHER): Payer: 59 | Admitting: Certified Nurse Midwife

## 2014-02-20 VITALS — BP 110/70 | HR 72 | Resp 16 | Ht 65.25 in | Wt 149.0 lb

## 2014-02-20 DIAGNOSIS — Z0000 Encounter for general adult medical examination without abnormal findings: Secondary | ICD-10-CM

## 2014-02-20 DIAGNOSIS — N898 Other specified noninflammatory disorders of vagina: Secondary | ICD-10-CM

## 2014-02-20 LAB — POCT URINALYSIS DIPSTICK
Bilirubin, UA: NEGATIVE
Blood, UA: NEGATIVE
Glucose, UA: NEGATIVE
Ketones, UA: NEGATIVE
LEUKOCYTES UA: NEGATIVE
Nitrite, UA: NEGATIVE
PH UA: 5
Protein, UA: NEGATIVE
UROBILINOGEN UA: NEGATIVE

## 2014-02-20 NOTE — Progress Notes (Signed)
48 y.o. [id number] Married African American Fe here to re- establish gyn care. Patient was told to establish care, but does not need  annual exam. Patient has been seeing PCP Dr. Drema Dallas and had a pap smear this year. Patient is being seen by endocrine for management of Hyperthyroidism, no medication at this point. Patient also is being followed  With GI. Patient complaining of vaginal odor and would like it checked. No new personal products or pain sexual activity. Patient would like a pelvic exam to know everything is normal. She occasionally notes cramping in pelvic area and was told it is related to her GI issues. No other health issues today.  Patient's last menstrual period was 02/12/2014.          Sexually active: yes  The current method of family planning is tubal ligation.    Exercising: yes  occ Smoker:  no  Health Maintenance: Pap:  2015 normal per patient MMG:  08-30-13 normal Colonoscopy: barium enema 3/15 normal BMD:   none TDaP:not sure of date Labs: Poct urine-neg Self breast exam: not done   reports that she has never smoked. She has never used smokeless tobacco. She reports that she does not drink alcohol or use illicit drugs.  Past Medical History  Diagnosis Date  . Allergic rhinitis, cause unspecified   . Chest pain, unspecified   . Hyperthyroidism     reported as borderline by Dr. Vashti Hey  . Tinnitus   . Fibrocystic breast   . Dysmenorrhea   . Palpitations     Past Surgical History  Procedure Laterality Date  . Tonsillectomy    . Tubal ligation    . Cesarean section      times 2  . Breast biopsy      for fibrocystic breast times 2    Current Outpatient Prescriptions  Medication Sig Dispense Refill  . albuterol (PROVENTIL HFA;VENTOLIN HFA) 108 (90 BASE) MCG/ACT inhaler Inhale 1 puff into the lungs every 6 (six) hours as needed for wheezing or shortness of breath.      . cetirizine (ZYRTEC) 10 MG tablet Take 5 mg by mouth daily.      . montelukast  (SINGULAIR) 10 MG tablet Take 10 mg by mouth at bedtime.      . Multiple Vitamins-Minerals (MULTIVITAMIN PO) Take by mouth as needed.       No current facility-administered medications for this visit.    Family History  Problem Relation Age of Onset  . Heart murmur Sister   . Heart murmur Maternal Aunt   . Diabetes Maternal Grandmother   . Hypertension Maternal Grandmother   . Stroke Maternal Grandmother   . Heart attack Paternal Grandfather   . Heart murmur Maternal Aunt   . Heart murmur Daughter     ROS:  Pertinent items are noted in HPI.  Otherwise, a comprehensive ROS was negative.  Exam:   BP 110/70  Pulse 72  Resp 16  Ht 5' 5.25" (1.657 m)  Wt 149 lb (67.586 kg)  BMI 24.62 kg/m2  LMP 02/12/2014 Height: 5' 5.25" (165.7 cm)  Ht Readings from Last 3 Encounters:  02/20/14 5' 5.25" (1.657 m)  11/17/13 5\' 4"  (1.626 m)  01/18/09 5\' 4"  (1.626 m)    General appearance: alert, cooperative and appears stated age Head: Normocephalic, without obvious abnormality, atraumatic Abdomen: soft, non-tender; no masses,  no organomegaly Extremities: extremities normal, atraumatic, no cyanosis or edema Skin: Skin color, texture, turgor normal. No rashes or lesions No abnormal  inguinal nodes palpated Neurologic: Grossly normal   Pelvic: External genitalia:  no lesions              Urethra:  normal appearing urethra with no masses, tenderness or lesions              Bartholin's and Skene's: normal                 Vagina: normal appearing vagina with normal color and discharge, no lesions, wet prep taken ph 3.5              Cervix: normal, non tender              Pap taken: no Bimanual Exam:  Uterus:  normal size, contour, position, consistency, mobility, non-tender and anteverted              Adnexa: normal adnexa and no mass, fullness, tenderness           A: Normal pelvic exam   Normal vaginal discharge with negative wet prep    Patient under follow up for colon issues with GI    Aex due 2016   P:  Reviewed findings of normal pelvic exam.        Reviewed findings of negative wet prep and vaginal discharge has normal appearance, reassured. Questions addressed. Patient will be in [place] for several months now, recent trip to [place] for relatives funeral.    return annually or prn  An After Visit Summary was printed and given to the patient.

## 2014-02-20 NOTE — Patient Instructions (Addendum)

## 2014-05-02 ENCOUNTER — Encounter: Payer: Self-pay | Admitting: *Deleted

## 2014-05-03 ENCOUNTER — Encounter: Payer: Self-pay | Admitting: Neurology

## 2014-05-03 ENCOUNTER — Ambulatory Visit (INDEPENDENT_AMBULATORY_CARE_PROVIDER_SITE_OTHER): Payer: 59 | Admitting: Neurology

## 2014-05-03 VITALS — BP 100/66 | HR 75 | Ht 66.5 in | Wt 148.8 lb

## 2014-05-03 DIAGNOSIS — M5416 Radiculopathy, lumbar region: Secondary | ICD-10-CM

## 2014-05-03 DIAGNOSIS — G43709 Chronic migraine without aura, not intractable, without status migrainosus: Secondary | ICD-10-CM

## 2014-05-03 DIAGNOSIS — IMO0002 Reserved for concepts with insufficient information to code with codable children: Secondary | ICD-10-CM

## 2014-05-03 DIAGNOSIS — G629 Polyneuropathy, unspecified: Secondary | ICD-10-CM

## 2014-05-03 DIAGNOSIS — G609 Hereditary and idiopathic neuropathy, unspecified: Secondary | ICD-10-CM

## 2014-05-03 NOTE — Patient Instructions (Signed)
Overall you are doing fairly well but I do want to suggest a few things today:   Remember to drink plenty of fluid, eat healthy meals and do not skip any meals. Try to eat protein with a every meal and eat a healthy snack such as fruit or nuts in between meals. Try to keep a regular sleep-wake schedule and try to exercise daily, particularly in the form of walking, 20-30 minutes a day, if you can.   As far as diagnostic testing: bloodwork, emg/ncs and botox  I would like to see you back in 3 montha, sooner if we need to. Please call us with any interim questions, concerns, problems, updates or refill requests.   Please also call us for any test results so we can go over those with you on the phone.  My clinical assistant and will answer any of your questions and relay your messages to me and also relay most of my messages to you.   Our phone number is [phone]. We also have an after hours call service for urgent matters and there is a physician on-call for urgent questions. For any emergencies you know to call 911 or go to the nearest emergency room

## 2014-05-03 NOTE — Progress Notes (Addendum)
[facility]    Provider:  Dr Jaynee Eagles Referring Provider: Randa Lynn* Primary Care Physician:  Gerrit Heck, MD  CC:  Peripheral Neuropathy  HPI:  Suzanne Anderson is a 48 y.o. female PMHx of hyperhtyroidism, diabetes and headaches who is here as a referral from Dr. Drema Dallas for Peripheral Neuropathy.  She describes burning in the bottom of the feet, fingers and palms. Started in the bottom of the feet. Started last year. Worsening, progressing. Worse at night especially the palms. Better with drinking water. At night can't have the sheets touch the hands. Not really painful just uncomfortable and interrupts her doing things like opening bottles of water. The heaaches are daily,  Pressure in the temples. The migraines can't funtion, pounding 10/10, light sensitivity, phonophobia, onemonthly and she always gets them with her period. Perfume triggers it. Triptan helps but takes it when severe. Has sparkly lights, flashes before then and gets "befuzzled". Last hours to days. Learning to handle it better with diet. The daily pressure headaches get to about a 5-6/10 and takes naps to help, no OTC medications. Sleeps "ok" but the headaches wake her up. Both types started in 2010. Grandmother had migraines and used to pour alcohol on her head to try and make it better. First had migraines at the age of 18. Has had status migrainosus.   Denies muscle cramps. Balance is fine. No falls. Denies weakness. Has low back pain that radiates down the lateral part of the leg, like a shock. Unclear when the shocks happens, has soreness there all the time. Also has non-stop ringing in the ear, evaluation by ENT was negative. No focal neuro symptoms with headaches.   Reviewed notes, labs and imaging from outside physicians, which showed HgA1c 5.8, eGFR 100, B12 1648.   Review of Systems: Patient complains of symptoms per HPI as well as the following symptoms  Light  sensitivity, headache, ear ringing, back pain, itching. Pertinent negatives per HPI. Otherwise out of a complete 14 system review, and all other reviewed systems are negative.   History   Social History  . Marital Status: Married    Spouse Name: N/A    Number of Children: 2  . Years of Education: N/A   Occupational History  . Stay at home mom    Social History Main Topics  . Smoking status: Never Smoker   . Smokeless tobacco: Never Used  . Alcohol Use: No  . Drug Use: No  . Sexual Activity: Yes    Partners: Male    Birth Control/ Protection: Surgical     Comment: tubal ligation   Other Topics Concern  . Not on file   Social History Narrative  . No narrative on file    Family History  Problem Relation Age of Onset  . Heart murmur Sister   . Heart murmur Maternal Aunt   . Diabetes Maternal Grandmother   . Hypertension Maternal Grandmother   . Stroke Maternal Grandmother   . Heart attack Paternal Grandfather   . Heart murmur Maternal Aunt   . Heart murmur Daughter     Past Medical History  Diagnosis Date  . Allergic rhinitis, cause unspecified   . Chest pain, unspecified   . Hyperthyroidism     reported as borderline by Dr. Vashti Hey  . Tinnitus   . Fibrocystic breast   . Dysmenorrhea   . Palpitations   . Subacute thyroiditis   . Impaired fasting glucose   . Leukocytopenia, unspecified   .  Unspecified hereditary and idiopathic peripheral neuropathy     Past Surgical History  Procedure Laterality Date  . Tonsillectomy    . Tubal ligation    . Cesarean section      times 2  . Breast biopsy      for fibrocystic breast times 2    Current Outpatient Prescriptions  Medication Sig Dispense Refill  . albuterol (PROVENTIL HFA;VENTOLIN HFA) 108 (90 BASE) MCG/ACT inhaler Inhale 1 puff into the lungs every 6 (six) hours as needed for wheezing or shortness of breath.      . Calcium Acetate, Phos Binder, (CALCIUM ACETATE PO) Take by mouth.      . cetirizine  (ZYRTEC) 10 MG tablet Take 5 mg by mouth daily.      . montelukast (SINGULAIR) 10 MG tablet Take 10 mg by mouth at bedtime.      . Multiple Vitamins-Minerals (MULTIVITAMIN PO) Take by mouth as needed.      . Omega-3 Fatty Acids (FISH OIL) 1200 MG CPDR Take 1,200 mg by mouth daily.      . Probiotic Product (PROBIOTIC DAILY PO) Take by mouth.      . SUMAtriptan (IMITREX) 100 MG tablet Take 100 mg by mouth every 2 (two) hours as needed for migraine or headache. May repeat in 2 hours if headache persists or recurs.       No current facility-administered medications for this visit.    Allergies as of 05/03/2014 - Review Complete 02/20/2014  Allergen Reaction Noted  . Flexeril [cyclobenzaprine]  02/20/2014  . Peanuts [peanut oil] Nausea And Vomiting 11/18/2013  . Terazol [terconazole] Swelling   . Vicodin [hydrocodone-acetaminophen]  02/20/2014  . Monistat [miconazole] Rash     Vitals: There were no vitals taken for this visit. Last Weight:  Wt Readings from Last 1 Encounters:  02/20/14 149 lb (67.586 kg)   Last Height:   Ht Readings from Last 1 Encounters:  02/20/14 5' 5.25" (1.657 m)     Physical exam: Exam: Gen: NAD, conversant Eyes: anicteric sclerae, moist conjunctivae HENT: Atraumatic, oropharynx clear Neck: Trachea midline; supple,  Lungs: CTA, no wheezing, rales, rhonic                          CV: RRR, no MRG Abdomen: Soft, non-tender;  Extremities: No peripheral edema  Skin: Normal temperature, no rash,  Psych: Appropriate affect, pleasant  Neuro: Detailed Neurologic Exam  Speech:    Speech is normal; fluent and spontaneous with normal comprehension.  Cognition:    The patient is oriented to person, place, and time; memory intact; language fluent; normal attention, concentration, and fund of knowledge.  Cranial Nerves:    The pupils are equal, round, and reactive to light. The fundi are normal and spontaneous venous pulsations are present. Visual fields are  full to finger confrontation. Extraocular movements are intact. Trigeminal sensation is intact and the muscles of mastication are normal. The face is symmetric. The palate elevates in the midline. Voice is normal. Shoulder shrug is normal. The tongue has normal motion without fasciculations.   Coordination:    Normal finger to nose and heel to shin. Normal rapid alternating movements.   Gait:    Heel-toe and tandem gait are normal.   Motor Observation:    No asymmetry, no atrophy, and no involuntary movements noted.  Tone:    Normal muscle tone.   Posture:    Posture is normal. normal erect    Strength:  Strength is V/V in the upper and lower limbs.            Vibratory Sensation:    Normal vibratory sensation in upper and lower extremities.    Light Touch:    Normal light touch sensation in upper and lower extremities.  Proprioception:    Normal proprioception in upper and lower extremities.   Pin Prick:    Normal sensation to pinprick in upper and lower extremities.  Temperature:    Normal temperature sensation in upper and lower extremities.  Reflex Exam:  DTR's:    Deep tendon reflexes in the upper and lower extremities are normal bilaterally.   Toes:    The toes are downgoing bilaterally.    Assessment:  48 year old female with glucose intolerance and a PMHx of migraines who is here for evaluation of burning in the feet and hands. She also has chronic migraines and lumbar radiculopathy. Will order a serum neuropathy screen and further eval radiculopathy and neuropathy with emg/ncs. Recommended botox of patient, provided information to her and discussed.  A total of 60 minutes was spent in with this patient. Over half this time was spent on counseling patient on the diagnosis and different therapeutic options available.    Sarina Ill, MD  [facility] [address] [place], [place] [address]  Phone [phone] Fax  [phone] Lenor Coffin

## 2014-05-06 ENCOUNTER — Encounter: Payer: Self-pay | Admitting: Neurology

## 2014-05-06 DIAGNOSIS — IMO0002 Reserved for concepts with insufficient information to code with codable children: Secondary | ICD-10-CM | POA: Insufficient documentation

## 2014-05-06 DIAGNOSIS — G43709 Chronic migraine without aura, not intractable, without status migrainosus: Secondary | ICD-10-CM | POA: Insufficient documentation

## 2014-05-06 DIAGNOSIS — M5416 Radiculopathy, lumbar region: Secondary | ICD-10-CM | POA: Insufficient documentation

## 2014-05-06 DIAGNOSIS — G629 Polyneuropathy, unspecified: Secondary | ICD-10-CM | POA: Insufficient documentation

## 2014-05-08 LAB — IFE AND PE, SERUM
ALBUMIN SERPL ELPH-MCNC: 4.1 g/dL (ref 3.2–5.6)
Albumin/Glob SerPl: 1.3 (ref 0.7–2.0)
Alpha 1: 0.2 g/dL (ref 0.1–0.4)
Alpha2 Glob SerPl Elph-Mcnc: 0.6 g/dL (ref 0.4–1.2)
B-Globulin SerPl Elph-Mcnc: 0.9 g/dL (ref 0.6–1.3)
Gamma Glob SerPl Elph-Mcnc: 1.6 g/dL (ref 0.5–1.6)
Globulin, Total: 3.3 g/dL (ref 2.0–4.5)
IGA/IMMUNOGLOBULIN A, SERUM: 197 mg/dL (ref 91–414)
IGG (IMMUNOGLOBIN G), SERUM: 1487 mg/dL (ref 700–1600)
IGM (IMMUNOGLOBULIN M), SRM: 106 mg/dL (ref 40–230)
Total Protein: 7.4 g/dL (ref 6.0–8.5)

## 2014-05-08 LAB — CRYOGLOBULIN, QL, SERUM, RFLX

## 2014-05-08 LAB — RHEUMATOID FACTOR: Rhuematoid fact SerPl-aCnc: 9.4 IU/mL (ref 0.0–13.9)

## 2014-05-08 LAB — LYME, TOTAL AB TEST/REFLEX: Lyme IgG/IgM Ab: <0.91 {ISR} (ref 0.00–0.90)

## 2014-05-08 LAB — PAN-ANCA
Atypical pANCA: <1:20 {titer}
C-ANCA: <1:20 {titer}
Myeloperoxidase Ab: <9 U/mL (ref 0.0–9.0)
P-ANCA: <1:20 {titer}

## 2014-05-08 LAB — ANA W/REFLEX: Anti Nuclear Antibody(ANA): NEGATIVE

## 2014-05-08 LAB — HIV ANTIBODY (ROUTINE TESTING W REFLEX)
HIV 1/O/2 Abs-Index Value: <1 (ref <1.00)
HIV-1/HIV-2 Ab: NONREACTIVE

## 2014-05-08 LAB — SEDIMENTATION RATE: Sed Rate: 7 mm/hr (ref 0–32)

## 2014-05-08 LAB — ANGIOTENSIN CONVERTING ENZYME: ANGIO CONVERT ENZYME: 21 U/L (ref 14–82)

## 2014-05-10 ENCOUNTER — Ambulatory Visit (INDEPENDENT_AMBULATORY_CARE_PROVIDER_SITE_OTHER): Payer: 59 | Admitting: Neurology

## 2014-05-10 ENCOUNTER — Ambulatory Visit (INDEPENDENT_AMBULATORY_CARE_PROVIDER_SITE_OTHER): Payer: Self-pay | Admitting: *Deleted

## 2014-05-10 ENCOUNTER — Encounter (INDEPENDENT_AMBULATORY_CARE_PROVIDER_SITE_OTHER): Payer: Self-pay

## 2014-05-10 DIAGNOSIS — G629 Polyneuropathy, unspecified: Secondary | ICD-10-CM

## 2014-05-10 DIAGNOSIS — M5416 Radiculopathy, lumbar region: Secondary | ICD-10-CM

## 2014-05-10 DIAGNOSIS — Z0289 Encounter for other administrative examinations: Secondary | ICD-10-CM

## 2014-05-10 DIAGNOSIS — G588 Other specified mononeuropathies: Secondary | ICD-10-CM

## 2014-05-10 DIAGNOSIS — G43709 Chronic migraine without aura, not intractable, without status migrainosus: Secondary | ICD-10-CM

## 2014-05-10 DIAGNOSIS — G609 Hereditary and idiopathic neuropathy, unspecified: Secondary | ICD-10-CM

## 2014-05-10 MED ORDER — KETOROLAC TROMETHAMINE 30 MG/ML IJ SOLN
30.0000 mg | Freq: Once | INTRAMUSCULAR | Status: AC
  Administered 2014-05-10: 30 mg via INTRAMUSCULAR

## 2014-05-10 NOTE — Patient Instructions (Signed)
Pt to check out.  Will go home and rest/quiet/darkness and call back as needed.

## 2014-05-10 NOTE — Progress Notes (Signed)
Order for toradol 30mg  IM for migraine triggered by [place]/EMG testing done today by Dr. Jaynee Eagles.  Instructed that would give injection for migraine pain. Level 6  (toradol 30mg  IM).  Under aseptic technique toradol 30mg  IM injected to R Gluteal at 1021.    Tolerated well.  Pressure and bandage applied.  Pt to rest for 15 min and then VS taken and then go.  At 1045 105/70 HR- 60.  Doing ok.  Still with migraine. Did not give level.     Will go home and rest/ darkness/ quiet.  Will call back as needed.

## 2014-05-11 NOTE — Progress Notes (Addendum)
  [facility]    Provider:  Dr Jaynee Eagles Referring Provider: Randa Lynn* Primary Care Physician:  Gerrit Heck, MD  CC:  Paresthesias  History: Suzanne Anderson is a 48 y.o. female PMHx of hyperthyroidism, glucose intolerance and headaches who is here as a referral from Dr. Drema Dallas for paresthesias. She describes burning in the bottom of the feet, fingers and palms. Symptoms started in the bottom of the feet. Started last year. Worsening, progressing. Worse at night especially the palms. At night can't have the sheets touch the hands. Has low back pain with radicular symptoms. Focused lower extremity exam demonstrates 5/5 strength and intact sensation.    Summary: Nerve Conduction studies were performed on the upper and lower extremities.  Bilateral Ulnar and Median motor conductions were within normal limits.   Bilateral Median and Ulnar sensory conductions were within normal limits.  Right lower extremity Peroneal and Tibial motor conductions were within normal limits.   Right lower extremity Peroneal sensory conductions were within normal limits.    All F Wave latencies were within normal limits.    All H Reflex latencies were within normal limits.    EMG needle evaluation was performed on selected lower extremity muscles. EMG of the right Medial Gastrocnemius, right Extensor Hallucis Longus, right Biceps Femoris(long head) and L5/S1 paraspinal muscles showed increased spontaneous activity.  The following muscles were normal: right Vastus Medialis, right Anterior Tibialis, right Gluteus Minimus, right Gluteus Maximus.   Conclusion: This is an abnormal study.   1. The acute/ongoing denervation in right leg muscles that are supplied by the L5/S1 nerve roots in conjunction with right lower lumbar paraspinal spontaneous activity and normal sensory conductions suggest a right L5/S1 radiculopathy.   2. Denervating potentials in the left lower  paraspinal muscles are also consistent with left radiculopathy however the level can not be determined by paraspinals alone.  3. No electrophysiologic evidence for ulnar or median neuropathy or polyneuropathy. This test does not evaluate for small-fiber neuropathy. Suggest skin biopsy as clinically warranted.    Sarina Ill, MD  [facility] [address] [place], [place] [address]  Phone [phone] Fax [phone] Lenor Coffin

## 2014-05-16 ENCOUNTER — Ambulatory Visit (INDEPENDENT_AMBULATORY_CARE_PROVIDER_SITE_OTHER): Payer: 59

## 2014-05-16 DIAGNOSIS — G629 Polyneuropathy, unspecified: Secondary | ICD-10-CM

## 2014-05-16 DIAGNOSIS — IMO0002 Reserved for concepts with insufficient information to code with codable children: Secondary | ICD-10-CM

## 2014-05-16 DIAGNOSIS — G588 Other specified mononeuropathies: Secondary | ICD-10-CM

## 2014-05-16 DIAGNOSIS — G589 Mononeuropathy, unspecified: Secondary | ICD-10-CM

## 2014-05-16 DIAGNOSIS — M5416 Radiculopathy, lumbar region: Secondary | ICD-10-CM

## 2014-05-29 ENCOUNTER — Telehealth: Payer: Self-pay | Admitting: Neurology

## 2014-05-29 NOTE — Telephone Encounter (Signed)
Spoke with patient today. Discussed MRI l spine further. Need to schedule appt for muscle biopsy.

## 2014-06-11 ENCOUNTER — Telehealth: Payer: Self-pay

## 2014-06-11 NOTE — Telephone Encounter (Signed)
Called patient and left a message asking her to give the office a call back to get her appt with Dr.Ahern In October keep her follow appt in Nov.

## 2014-06-19 NOTE — Telephone Encounter (Signed)
Thank you! If she doesn't call back, i will just talk to her in November.

## 2014-06-19 NOTE — Telephone Encounter (Signed)
Called patient and left her another message about skin biopsy

## 2014-06-20 NOTE — Telephone Encounter (Signed)
Patient returning call to [facility] regarding scheduling skin biopsy, please call her back at [phone].

## 2014-06-25 NOTE — Telephone Encounter (Signed)
Called patient and left message to call back to schedule skin biopsy.

## 2014-07-02 NOTE — Telephone Encounter (Signed)
Patient returning call to [facility] regarding skin biopsy scheduling, please call her back at [phone].

## 2014-07-03 NOTE — Telephone Encounter (Signed)
Patient wants to have skin Biopsy when she come's in the November 30th I tried to get her to come sooner she wanted to wait.

## 2014-07-09 ENCOUNTER — Encounter: Payer: Self-pay | Admitting: Neurology

## 2014-07-10 ENCOUNTER — Telehealth: Payer: Self-pay | Admitting: Neurology

## 2014-07-10 NOTE — Telephone Encounter (Signed)
Patient requesting type of Arthritis Dr. Jaynee Eagles diagnosed patient having on disk contusion of Spine, also give medical tern for bone pressing on nerve.  Please call and may leave detailed message on voicemail

## 2014-07-11 NOTE — Telephone Encounter (Signed)
Fwd to Dr. Jaynee Eagles

## 2014-07-13 NOTE — Telephone Encounter (Signed)
Called patient. She couldn't talk. She is coming in for a skin biopsy this month and she wants to talk at that time. Her questions not urgent, no need for me to call back

## 2014-08-06 ENCOUNTER — Ambulatory Visit (INDEPENDENT_AMBULATORY_CARE_PROVIDER_SITE_OTHER): Payer: 59 | Admitting: Neurology

## 2014-08-06 ENCOUNTER — Encounter: Payer: Self-pay | Admitting: Neurology

## 2014-08-06 VITALS — BP 96/65 | HR 66 | Ht 66.5 in | Wt 148.8 lb

## 2014-08-06 DIAGNOSIS — G609 Hereditary and idiopathic neuropathy, unspecified: Secondary | ICD-10-CM

## 2014-08-08 NOTE — Progress Notes (Addendum)
Provider: Dr Jaynee Eagles Referring Provider: Randa Lynn* Primary Care Physician: Gerrit Heck, MD  CC: Peripheral Neuropathy  HPI: Suzanne Anderson is a 48 y.o. female PMHx of hyperhtyroidism, diabetes and headaches who is here as a referral from Dr. Drema Dallas for Peripheral Neuropathy. She describes burning in the bottom of the feet, fingers and palms. Started in the bottom of the feet. Started last year. Worsening, progressing. Worse at night especially the palms. Better with drinking water. At night can't have the sheets touch the hands. Not really painful just uncomfortable and interrupts her doing things like opening bottles of water. The heaaches are daily, Pressure in the temples. The migraines can't funtion, pounding 10/10, light sensitivity, phonophobia, onemonthly and she always gets them with her period. Perfume triggers it. Triptan helps but takes it when severe. Has sparkly lights, flashes before then and gets "befuzzled". Last hours to days. Learning to handle it better with diet. The daily pressure headaches get to about a 5-6/10 and takes naps to help, no OTC medications. Sleeps "ok" but the headaches wake her up. Both types started in 2010. Grandmother had migraines and used to pour alcohol on her head to try and make it better. First had migraines at the age of 33. Has had status migrainosus. Denies muscle cramps. Balance is fine. No falls. Denies weakness. Has low back pain that radiates down the lateral part of the leg, like a shock. Unclear when the shocks happens, has soreness there all the time. Also has non-stop ringing in the ear, evaluation by ENT was negative. No focal neuro symptoms with headaches.   EMG/NCS:  Conclusion: This is an abnormal study.  1. The acute/ongoing denervation in right leg muscles that are supplied by the L5/S1 nerve roots in conjunction with right lower lumbar paraspinal spontaneous activity and normal sensory conductions  suggest a right L5/S1 radiculopathy.  2. Denervating potentials in the left lower paraspinal muscles are also consistent with left radiculopathy however the level can not be determined by paraspinals alone.  3. No electrophysiologic evidence for ulnar or median neuropathy or polyneuropathy. This test does not evaluate for small-fiber neuropathy. Suggest skin biopsy as clinically warranted.    Skin Biopsy Procedue Today in the office: Patient was in right lateral recombinant position. Sterile technique. 1% lidocaine with epinephrine was used for local anesthesia. Punctuated skin biopsy was performed. 3 mm skin sample were obtained at at left foot, above left extensor digitorum brevis, and left lateral calf, 10 cm above lateral malleolus, lateral thigh, 20 cm below superior iliac spine.  Patient tolerated the procedure well.  The wound was covered with neosporin antibiotic cream and bandage. Lenor Coffin

## 2014-08-14 ENCOUNTER — Telehealth: Payer: Self-pay | Admitting: Neurology

## 2014-08-14 NOTE — Telephone Encounter (Signed)
Patient checking status of steroid injection referral.  Please call and advise.

## 2014-08-15 NOTE — Telephone Encounter (Signed)
Referral was sent. Will resend.

## 2014-08-23 ENCOUNTER — Other Ambulatory Visit: Payer: Self-pay | Admitting: Neurology

## 2014-08-23 ENCOUNTER — Telehealth: Payer: Self-pay | Admitting: Neurology

## 2014-08-23 DIAGNOSIS — M5416 Radiculopathy, lumbar region: Secondary | ICD-10-CM

## 2014-08-23 NOTE — Telephone Encounter (Signed)
Patient with bilateral lumbar radiculopathy. EMG/NCS with changes in L5 and S1 muscles with localization to the nerve roots. Referring for lumboscaral steroid injections bilaterally.    Original request placed in September, patient still awaiting call and is very upset. [facility] [place] [facility] - can you get this taken care of? Really appreciate it, thank you.

## 2014-08-24 ENCOUNTER — Other Ambulatory Visit: Payer: Self-pay | Admitting: Neurology

## 2014-08-24 ENCOUNTER — Telehealth: Payer: Self-pay | Admitting: Neurology

## 2014-08-24 DIAGNOSIS — M5416 Radiculopathy, lumbar region: Secondary | ICD-10-CM

## 2014-08-24 NOTE — Telephone Encounter (Signed)
Called [facility] at [phone] to speak to [facility]. Had to leave a message asking her to call the office in regards to this referral that was sent several times. Just want to know what the status is on it because the patient is very upset.

## 2014-08-24 NOTE — Telephone Encounter (Signed)
Spoke to patient, she has an appointment for epidural steroid injections next week. Also reported within normal results for he epidermal nerve density biopsies; within normal limits making small-fiber neuropathy unlikely cause of her paresthesias.

## 2014-08-24 NOTE — Telephone Encounter (Signed)
Manually faxed both previous order from 05/10/14 and New order from 12/17 to [place] for scheduling.

## 2014-08-29 ENCOUNTER — Ambulatory Visit
Admission: RE | Admit: 2014-08-29 | Discharge: 2014-08-29 | Disposition: A | Discharge status: Home / Self Care | Payer: 59 | Source: Ambulatory Visit | Attending: Neurology | Admitting: Neurology

## 2014-08-29 DIAGNOSIS — M5416 Radiculopathy, lumbar region: Secondary | ICD-10-CM

## 2014-08-29 MED ORDER — IOHEXOL 180 MG/ML  SOLN
1.0000 mL | Freq: Once | INTRAMUSCULAR | Status: AC | PRN
  Administered 2014-08-29: 1 mL via EPIDURAL

## 2014-08-29 MED ORDER — METHYLPREDNISOLONE ACETATE 40 MG/ML INJ SUSP (RADIOLOG
120.0000 mg | Freq: Once | INTRAMUSCULAR | Status: AC
  Administered 2014-08-29: 120 mg via EPIDURAL

## 2014-08-29 NOTE — Discharge Instructions (Signed)

## 2014-10-31 ENCOUNTER — Other Ambulatory Visit: Payer: Self-pay

## 2014-10-31 DIAGNOSIS — Z1231 Encounter for screening mammogram for malignant neoplasm of breast: Secondary | ICD-10-CM

## 2014-11-07 ENCOUNTER — Other Ambulatory Visit: Payer: Self-pay | Admitting: Family Medicine

## 2014-11-07 ENCOUNTER — Encounter (INDEPENDENT_AMBULATORY_CARE_PROVIDER_SITE_OTHER): Payer: Self-pay

## 2014-11-07 ENCOUNTER — Ambulatory Visit
Admission: RE | Admit: 2014-11-07 | Discharge: 2014-11-07 | Disposition: A | Discharge status: Home / Self Care | Payer: 59 | Source: Ambulatory Visit

## 2014-11-07 DIAGNOSIS — R928 Other abnormal and inconclusive findings on diagnostic imaging of breast: Secondary | ICD-10-CM

## 2014-11-07 DIAGNOSIS — Z1231 Encounter for screening mammogram for malignant neoplasm of breast: Secondary | ICD-10-CM

## 2014-11-15 ENCOUNTER — Ambulatory Visit
Admission: RE | Admit: 2014-11-15 | Discharge: 2014-11-15 | Disposition: A | Discharge status: Home / Self Care | Payer: 59 | Source: Ambulatory Visit | Attending: Family Medicine | Admitting: Family Medicine

## 2014-11-15 DIAGNOSIS — R928 Other abnormal and inconclusive findings on diagnostic imaging of breast: Secondary | ICD-10-CM

## 2015-04-05 ENCOUNTER — Ambulatory Visit (INDEPENDENT_AMBULATORY_CARE_PROVIDER_SITE_OTHER): Payer: 59 | Admitting: Certified Nurse Midwife

## 2015-04-05 ENCOUNTER — Encounter: Payer: Self-pay | Admitting: Certified Nurse Midwife

## 2015-04-05 VITALS — BP 100/60 | HR 68 | Resp 16 | Ht 65.25 in | Wt 145.0 lb

## 2015-04-05 DIAGNOSIS — Z01419 Encounter for gynecological examination (general) (routine) without abnormal findings: Secondary | ICD-10-CM

## 2015-04-05 DIAGNOSIS — Z124 Encounter for screening for malignant neoplasm of cervix: Secondary | ICD-10-CM | POA: Diagnosis not present

## 2015-04-05 DIAGNOSIS — Z0000 Encounter for general adult medical examination without abnormal findings: Secondary | ICD-10-CM | POA: Diagnosis not present

## 2015-04-05 NOTE — Progress Notes (Signed)
49 y.o. [id number] Married  African American Fe here for annual exam. Periods normal,but changing in frequency. Some every 21-28 days.. Migraines are occurring more frequently and has been evaluated in past and working on triggers now.. Patient has history of numerous breast benign masses, with recent re-evaluation of left breast with [place], all benign. Yearly follow up. Patient has not noted any changes. Sees PCP yearly/labs aex. All normal. Busy with teenage daughters and finding time for spouse and self. Occasional vaginal dryness, no issues. No other health issues today.  Patient's last menstrual period was 03/11/2015.          Sexually active: Yes.    The current method of family planning is tubal ligation.    Exercising: Yes.    cardio & weights Smoker:  no  Health Maintenance: Pap: 12/14 neg MMG: 11-15-14 left breast u/s f/u category D density, birads 2:neg Colonoscopy: 3/15 barium enema normal BMD:   none TDaP:  Unsure feel UTD Labs: Hgb-12.5 Self breast exam: not done   reports that she has never smoked. She has never used smokeless tobacco. She reports that she does not drink alcohol or use illicit drugs.  Past Medical History  Diagnosis Date  . Allergic rhinitis, cause unspecified   . Chest pain, unspecified   . Hyperthyroidism     reported as borderline by Dr. Vashti Hey  . Tinnitus   . Fibrocystic breast   . Dysmenorrhea   . Palpitations   . Subacute thyroiditis   . Impaired fasting glucose   . Leukocytopenia, unspecified   . Unspecified hereditary and idiopathic peripheral neuropathy     Past Surgical History  Procedure Laterality Date  . Tonsillectomy    . Tubal ligation    . Cesarean section      times 2  . Breast biopsy      for fibrocystic breast times 2    Current Outpatient Prescriptions  Medication Sig Dispense Refill  . albuterol (PROVENTIL HFA;VENTOLIN HFA) 108 (90 BASE) MCG/ACT inhaler Inhale 1 puff into the lungs every 6 (six) hours as needed for  wheezing or shortness of breath.    . Calcium Acetate, Phos Binder, (CALCIUM ACETATE PO) Take 600 mg by mouth daily.     . cetirizine (ZYRTEC) 10 MG tablet Take 10 mg by mouth daily. Taking 1/2 tablet daily    . montelukast (SINGULAIR) 10 MG tablet Take 10 mg by mouth at bedtime.    . Multiple Vitamins-Minerals (MULTIVITAMIN PO) Take by mouth as needed.    . Omega-3 Fatty Acids (FISH OIL PO) Take 1,400 mg by mouth daily.    . SUMAtriptan (IMITREX) 100 MG tablet Take 100 mg by mouth every 2 (two) hours as needed for migraine or headache. May repeat in 2 hours if headache persists or recurs.     No current facility-administered medications for this visit.    Family History  Problem Relation Age of Onset  . Heart murmur Sister   . Heart murmur Maternal Aunt   . Diabetes Maternal Grandmother   . Hypertension Maternal Grandmother   . Stroke Maternal Grandmother   . Migraines Maternal Grandmother   . Heart attack Paternal Grandfather   . Heart murmur Maternal Aunt   . Heart murmur Daughter     ROS:  Pertinent items are noted in HPI.  Otherwise, a comprehensive ROS was negative.  Exam:   BP 100/60 mmHg  Pulse 68  Resp 16  Ht 5' 5.25" (1.657 m)  Wt 145 lb (65.772  kg)  BMI 23.95 kg/m2  LMP 03/11/2015 Height: 5' 5.25" (165.7 cm) Ht Readings from Last 3 Encounters:  04/05/15 5' 5.25" (1.657 m)  08/06/14 5' 6.5" (1.689 m)  05/03/14 5' 6.5" (1.689 m)    General appearance: alert, cooperative and appears stated age Head: Normocephalic, without obvious abnormality, atraumatic Neck: no adenopathy, supple, symmetrical, trachea midline and thyroid normal to inspection and palpation Lungs: clear to auscultation bilaterally Breasts: normal appearance, no masses or tenderness, No nipple retraction or dimpling, No nipple discharge or bleeding, No axillary or supraclavicular adenopathy, numerous masses noted in both breast, palpated one under concern in left breasts and feel no change Heart:  regular rate and rhythm Abdomen: soft, non-tender; no masses,  no organomegaly Extremities: extremities normal, atraumatic, no cyanosis or edema Skin: Skin color, texture, turgor normal. No rashes or lesions Lymph nodes: Cervical, supraclavicular, and axillary nodes normal. No abnormal inguinal nodes palpated Neurologic: Grossly normal   Pelvic: External genitalia:  no lesions              Urethra:  normal appearing urethra with no masses, tenderness or lesions              Bartholin's and Skene's: normal                 Vagina: normal appearing vagina with normal color and discharge, no lesions              Cervix: normal,non tender,no lesions              Pap taken: Yes.   Bimanual Exam:  Uterus:  normal size, contour, position, consistency, mobility, non-tender and anteverted              Adnexa: normal adnexa and no mass, fullness, tenderness               Rectovaginal: Confirms               Anus:  normal sphincter tone, no lesions  Chaperone present: Yes  A:  Well Woman with normal exam  Contraception BTL  Left breast mass f/u benign under year follow up now, no noted breast change from mammogram report  Perimenopausal with some cycle change    P:   Reviewed health and wellness pertinent to exam  Continue SBE and notify if any changes and continue yearly mammogram as recommended with 3 D mammogram  Discussed perimenopause and etiology and bleeding expectations. Given Menses calendar with normal and abnormal parameters and need to advise if occurs.  Pap smear taken with HPVHR   counseled on breast self exam, mammography screening, adequate intake of calcium and vitamin D, diet and exercise  return annually or prn  An After Visit Summary was printed and given to the patient.

## 2015-04-05 NOTE — Patient Instructions (Signed)

## 2015-04-10 LAB — IPS PAP TEST WITH HPV

## 2015-04-10 LAB — HEMOGLOBIN, FINGERSTICK: HEMOGLOBIN, FINGERSTICK: 12.5 g/dL (ref 12.0–16.0)

## 2015-04-10 NOTE — Progress Notes (Signed)
Reviewed personally.  M. Suzanne Jahmeek Shirk, MD.  

## 2015-10-16 ENCOUNTER — Other Ambulatory Visit: Payer: Self-pay

## 2015-10-16 DIAGNOSIS — Z1231 Encounter for screening mammogram for malignant neoplasm of breast: Secondary | ICD-10-CM

## 2015-11-11 ENCOUNTER — Ambulatory Visit
Admission: RE | Admit: 2015-11-11 | Discharge: 2015-11-11 | Disposition: A | Discharge status: Home / Self Care | Payer: 59 | Source: Ambulatory Visit

## 2015-11-11 DIAGNOSIS — Z1231 Encounter for screening mammogram for malignant neoplasm of breast: Secondary | ICD-10-CM

## 2015-12-18 ENCOUNTER — Ambulatory Visit (INDEPENDENT_AMBULATORY_CARE_PROVIDER_SITE_OTHER): Payer: 59 | Admitting: Interventional Cardiology

## 2015-12-18 ENCOUNTER — Encounter: Payer: Self-pay | Admitting: Interventional Cardiology

## 2015-12-18 VITALS — BP 88/60 | HR 89 | Ht 65.0 in | Wt 152.6 lb

## 2015-12-18 DIAGNOSIS — R55 Syncope and collapse: Secondary | ICD-10-CM

## 2015-12-18 DIAGNOSIS — R7302 Impaired glucose tolerance (oral): Secondary | ICD-10-CM | POA: Diagnosis not present

## 2015-12-18 DIAGNOSIS — R0789 Other chest pain: Secondary | ICD-10-CM | POA: Diagnosis not present

## 2015-12-18 NOTE — Progress Notes (Signed)
Cardiology Office Note   Date:  12/18/2015   ID:  Suzanne Anderson 05/10/1966, MRN [id number]  PCP:  Gerrit Heck, MD  Cardiologist:  Sinclair Grooms, MD   Chief Complaint  Patient presents with  . Palpitations      History of Present Illness: Suzanne Anderson is a 50 y.o. female who presents for Evaluation of syncope and chest tightness  The patient is 50 years of age, married, and has 2 teenage notice. She is referred by Dr. Drema Dallas because of the recent episode of near-syncope. This is on a background of focal left parasternal chest discomfort that occurs on exertion. The chest discomfort has been occurring for years. Stress test was done because of this complaint in 2006 by Fabio Asa. No abnormality was identified. The intensity and nature of the discomfort has not significantly changed since that time.  The other component is that of near syncope. On the occasion of concern, the patient had come upstairs to check on her daughters who were preparing for bed. They were talking and suddenly she noted palpitations. This caused her to feel very weak. She felt as though she might faint so she sat down on the floor and stayed there until she began feeling better. There was a sensation of clamminess during this episode. Palpitations eventually resolved. There was no particular chest discomfort on this occasion. Since this episode 3 weeks ago, she has been troubled by brief palpitations.  The patient has had a prior history of fainting. She relates this to being on cyclobenzaprine/hydrocodone/prednisone in the setting of chronic back pain. She feels that the medications precipitated the episode. She denies syncope under other circumstances. Those episodes were associated with nausea and diaphoresis.  Past Medical History  Diagnosis Date  . Allergic rhinitis, cause unspecified   . Chest pain, unspecified   . Hyperthyroidism     reported as borderline by  Dr. Vashti Hey  . Tinnitus   . Fibrocystic breast   . Dysmenorrhea   . Palpitations   . Subacute thyroiditis   . Impaired fasting glucose   . Leukocytopenia, unspecified   . Unspecified hereditary and idiopathic peripheral neuropathy     Past Surgical History  Procedure Laterality Date  . Tonsillectomy    . Tubal ligation    . Cesarean section      times 2  . Breast biopsy      for fibrocystic breast times 2     Current Outpatient Prescriptions  Medication Sig Dispense Refill  . albuterol (PROVENTIL HFA;VENTOLIN HFA) 108 (90 BASE) MCG/ACT inhaler Inhale 1 puff into the lungs every 6 (six) hours as needed for wheezing or shortness of breath.    . Calcium Acetate, Phos Binder, (CALCIUM ACETATE PO) Take 600 mg by mouth daily.     . cetirizine (ZYRTEC) 10 MG tablet Take 10 mg by mouth daily. Taking 1/2 tablet daily    . montelukast (SINGULAIR) 10 MG tablet Take 10 mg by mouth at bedtime.    . Multiple Vitamins-Minerals (MULTIVITAMIN PO) Take by mouth as needed.    . Omega-3 Fatty Acids (FISH OIL PO) Take 1,400 mg by mouth daily.    . SUMAtriptan (IMITREX) 100 MG tablet Take 100 mg by mouth every 2 (two) hours as needed for migraine or headache. May repeat in 2 hours if headache persists or recurs.     No current facility-administered medications for this visit.    Allergies:   Flexeril; Peanuts; Terazol; Vicodin; and  Monistat    Social History:  The patient  reports that she has never smoked. She has never used smokeless tobacco. She reports that she does not drink alcohol or use illicit drugs.   Family History:  The patient's family history includes Diabetes in her maternal grandmother; Heart attack in her paternal grandfather; Heart murmur in her daughter, maternal aunt, maternal aunt, and sister; Hypertension in her maternal grandmother; Migraines in her maternal grandmother; Stroke in her maternal grandmother.    ROS:  Please see the history of present illness.    Otherwise, review of systems are positive for recurring episodes of dizziness, back discomfort, headache, and occasional palpitations and irregular heartbeat.. She has a history of thyroid disease that is followed by Dr. Chalmers Cater. Prior cardiac workup with an access treadmill test in 2006 was negative Fabio Asa) has a history of recurring headaches and managed by Dr. Catalina Gravel  All other systems are reviewed and negative.    PHYSICAL EXAM: VS:  BP 88/60 mmHg  Pulse 89  Ht 5\' 5"  (1.651 m)  Wt 152 lb 9.6 oz (69.219 kg)  BMI 25.39 kg/m2  SpO2 99%  LMP 12/09/2015 (Approximate) , BMI Body mass index is 25.39 kg/(m^2). GEN: Well nourished, well developed, in no acute distress HEENT: normal Neck: no JVD, carotid bruits, or masses Cardiac: RRR.  There is no murmur, rub, or gallop. There is no edema. Respiratory:  clear to auscultation bilaterally, normal work of breathing. GI: soft, nontender, nondistended, + BS MS: no deformity or atrophy Skin: warm and dry, no rash Neuro:  Strength and sensation are intact Psych: euthymic mood, full affect   EKG:  EKG is not ordered today. The ekg reveals the electrocardiogram performed on 12/16/15 by Dr. Drema Dallas revealed normal sinus rhythm, and normal tracing. Insignificant inferior Q waves are noted.   Recent Labs: No results found for requested labs within last 365 days.    Lipid Panel No results found for: CHOL, TRIG, HDL, CHOLHDL, VLDL, LDLCALC, LDLDIRECT    Wt Readings from Last 3 Encounters:  12/18/15 152 lb 9.6 oz (69.219 kg)  04/05/15 145 lb (65.772 kg)  08/06/14 148 lb 12.8 oz (67.495 kg)      Other studies Reviewed: Additional studies/ records that were reviewed today include: Records from [facility]. The findings include EKG was reviewed. Normal laboratory data was reviewed. HDL cholesterol is 79. Hemoglobin is 13.9..    ASSESSMENT AND PLAN:  1. Chest tightness Has occurred intermittently over the past 11 years. A  treadmill stress test done 11 years ago was negative.  2. History of recurring syncope with prodrome This suggests the possibility of neurally mediated near-syncope/syncope  3. Palpitations occurring in relationship to near-syncope Rule out significant arrhythmia such as SVT or atrial fibrillation with an access re-pathway   Current medicines are reviewed at length with the patient today.  The patient has the following concerns regarding medicines: none.  The following changes/actions have been instituted:    Thirty-day continuous monitor. Okay to exercise during monitoring.  2-D Doppler echocardiogram to assess left ventricular size and function as well as her right ventricle.  Consider repeat ischemia workup depending upon results of above the data  Labs/ tests ordered today include:  No orders of the defined types were placed in this encounter.     Disposition:   FU with HS in 6 weeks  Signed, Sinclair Grooms, MD  12/18/2015 12:08 PM    [id number]  [address], [place], [place]  [address] Phone: [phone]; Fax: [phone]

## 2015-12-18 NOTE — Patient Instructions (Signed)
Medication Instructions:  Your physician recommends that you continue on your current medications as directed. Please refer to the Current Medication list given to you today.   Labwork: None ordered  Testing/Procedures: Your physician has requested that you have an echocardiogram. Echocardiography is a painless test that uses sound waves to create images of your heart. It provides your doctor with information about the size and shape of your heart and how well your heart's chambers and valves are working. This procedure takes approximately one hour. There are no restrictions for this procedure.  Your physician has recommended that you wear an event monitor. Event monitors are medical devices that record the heart's electrical activity. Doctors most often [place] these monitors to diagnose arrhythmias. Arrhythmias are problems with the speed or rhythm of the heartbeat. The monitor is a small, portable device. You can wear one while you do your normal daily activities. This is usually used to diagnose what is causing palpitations/syncope (passing out).   Follow-Up: Your physician recommends that you schedule a follow-up appointment in: 10-12 weeks  Any Other Special Instructions Will Be Listed Below (If Applicable).     If you need a refill on your cardiac medications before your next appointment, please call your pharmacy.

## 2015-12-24 ENCOUNTER — Ambulatory Visit (INDEPENDENT_AMBULATORY_CARE_PROVIDER_SITE_OTHER): Payer: 59

## 2015-12-24 DIAGNOSIS — R55 Syncope and collapse: Secondary | ICD-10-CM

## 2016-01-03 ENCOUNTER — Other Ambulatory Visit: Payer: Self-pay

## 2016-01-03 ENCOUNTER — Ambulatory Visit (HOSPITAL_COMMUNITY): Payer: 59 | Attending: Cardiovascular Disease

## 2016-01-03 DIAGNOSIS — Z8249 Family history of ischemic heart disease and other diseases of the circulatory system: Secondary | ICD-10-CM | POA: Insufficient documentation

## 2016-01-03 DIAGNOSIS — R55 Syncope and collapse: Secondary | ICD-10-CM | POA: Diagnosis present

## 2016-02-12 ENCOUNTER — Telehealth: Payer: Self-pay | Admitting: Interventional Cardiology

## 2016-02-12 NOTE — Telephone Encounter (Signed)
Returned pt call.[email or web address] 

## 2016-02-12 NOTE — Telephone Encounter (Signed)
Returning your call. °

## 2016-02-12 NOTE — Telephone Encounter (Signed)
-----   Message from Belva Crome, MD sent at 02/08/2016  6:25 PM EDT ----- Let her know that no problems identified. No further w/u for now. If additional fainting, will need loop recorder.

## 2016-02-14 NOTE — Telephone Encounter (Signed)
Pt made aware of monitor results.

## 2016-02-14 NOTE — Telephone Encounter (Signed)
F/u Message ° °Pt returning RN call  °

## 2016-04-07 ENCOUNTER — Encounter: Payer: Self-pay | Admitting: Certified Nurse Midwife

## 2016-04-07 ENCOUNTER — Ambulatory Visit (INDEPENDENT_AMBULATORY_CARE_PROVIDER_SITE_OTHER): Payer: 59 | Admitting: Certified Nurse Midwife

## 2016-04-07 VITALS — BP 110/70 | HR 70 | Resp 16 | Ht 65.25 in | Wt 149.0 lb

## 2016-04-07 DIAGNOSIS — Z01419 Encounter for gynecological examination (general) (routine) without abnormal findings: Secondary | ICD-10-CM

## 2016-04-07 DIAGNOSIS — Z0000 Encounter for general adult medical examination without abnormal findings: Secondary | ICD-10-CM

## 2016-04-07 NOTE — Patient Instructions (Signed)

## 2016-04-07 NOTE — Progress Notes (Signed)
50 y.o. [id number] Married African American Fe here for annual exam. Periods normal, lighter, no issues. Exercises daily and use of weights.  Headaches are much better, no Imitrex use now. Sees PCP Dr. Drema Dallas for aex and labs. Sees Dr Chalmers Cater for thyroid management and all stable with Hyperthyroid management. Some digestion changes with milk use, some bloating. Working out in [employer] daily and enjoys there work out. No other concerns today. Daughter in high school this fall!   LMP: 03-24-16         Sexually active: Yes.    The current method of family planning is tubal ligation.    Exercising: Yes.    weights Smoker:  no  Health Maintenance: Pap: 04-05-15 neg HPV HR neg MMG:  11-11-15 category d density birads 1:neg Colonoscopy:  3/15 barium enema normal BMD:   none TDaP:   Pt to check when date was feels it is up to date Shingles: no Pneumonia: no Hep C and HIV: HIV neg 2015 Labs: poct urine-neg Self breast exam: not done   reports that she has never smoked. She has never used smokeless tobacco. She reports that she does not drink alcohol or use drugs.  Past Medical History:  Diagnosis Date  . Allergic rhinitis, cause unspecified   . Chest pain, unspecified   . Dysmenorrhea   . Fibrocystic breast   . Hyperthyroidism    reported as borderline by Dr. Vashti Hey  . Impaired fasting glucose   . Leukocytopenia, unspecified   . Palpitations   . Subacute thyroiditis   . Tinnitus   . Unspecified hereditary and idiopathic peripheral neuropathy     Past Surgical History:  Procedure Laterality Date  . BREAST BIOPSY     for fibrocystic breast times 2  . CESAREAN SECTION     times 2  . TONSILLECTOMY    . TUBAL LIGATION      Current Outpatient Prescriptions  Medication Sig Dispense Refill  . albuterol (PROVENTIL HFA;VENTOLIN HFA) 108 (90 BASE) MCG/ACT inhaler Inhale 1 puff into the lungs every 6 (six) hours as needed for wheezing or shortness of breath.    . Calcium Acetate, Phos Binder,  (CALCIUM ACETATE PO) Take 600 mg by mouth daily.     . cetirizine (ZYRTEC) 10 MG tablet Take 10 mg by mouth daily. Taking 1/2 tablet daily    . montelukast (SINGULAIR) 10 MG tablet Take 10 mg by mouth at bedtime.    . Multiple Vitamins-Minerals (MULTIVITAMIN PO) Take by mouth as needed.    . Omega-3 Fatty Acids (FISH OIL PO) Take 1,400 mg by mouth daily.    . SUMAtriptan (IMITREX) 100 MG tablet Take 100 mg by mouth every 2 (two) hours as needed for migraine or headache. May repeat in 2 hours if headache persists or recurs.     No current facility-administered medications for this visit.     Family History  Problem Relation Age of Onset  . Heart murmur Sister   . Heart murmur Maternal Aunt   . Diabetes Maternal Grandmother   . Hypertension Maternal Grandmother   . Stroke Maternal Grandmother   . Migraines Maternal Grandmother   . Heart attack Paternal Grandfather   . Heart murmur Maternal Aunt   . Heart murmur Daughter     ROS:  Pertinent items are noted in HPI.  Otherwise, a comprehensive ROS was negative.  Exam:   Ht 5' 5.25" (1.657 m)   Wt 149 lb (67.6 kg)   BMI 24.61 kg/m  Height: 5' 5.25" (165.7 cm) Ht Readings from Last 3 Encounters:  04/07/16 5' 5.25" (1.657 m)  12/18/15 5\' 5"  (1.651 m)  04/05/15 5' 5.25" (1.657 m)    General appearance: alert, cooperative and appears stated age Head: Normocephalic, without obvious abnormality, atraumatic Neck: no adenopathy, supple, symmetrical, trachea midline and thyroid  nodular Lungs: clear to auscultation bilaterally Breasts: normal appearance, no masses or tenderness, No nipple retraction or dimpling, No nipple discharge or bleeding, No axillary or supraclavicular adenopathy, right breast fibrocystic feel Heart: regular rate and rhythm Abdomen: soft, non-tender; no masses,  no organomegaly Extremities: extremities normal, atraumatic, no cyanosis or edema Skin: Skin color, texture, turgor normal. No rashes or lesions Lymph  nodes: Cervical, supraclavicular, and axillary nodes normal. No abnormal inguinal nodes palpated Neurologic: Grossly normal   Pelvic: External genitalia:  no lesions              Urethra:  normal appearing urethra with no masses, tenderness or lesions              Bartholin's and Skene's: normal                 Vagina: normal appearing vagina with normal color and discharge, no lesions              Cervix: multiparous appearance, no cervical motion tenderness and no lesions              Pap taken: Yes.   Bimanual Exam:  Uterus:  normal size, contour, position, consistency, mobility, non-tender              Adnexa: normal adnexa and no mass, fullness, tenderness               Rectovaginal: Confirms               Anus:  normal sphincter tone, no lesions  Chaperone present: yes  A:  Well Woman with normal exam  Contraception tubal ligation  ? Lactose intolerance  Hyperthyroid with Endocrine management  P:   Reviewed health and wellness pertinent to exam  Discussed trial of lactose free milk and see if this resolves. If not needs to advise or discuss with PCP.  Continue follow up with MD  Pap smear as above not taken   counseled on breast self exam, mammography screening, adequate intake of calcium and vitamin D, diet and exercise  return annually or prn  An After Visit Summary was printed and given to the patient.

## 2016-04-08 LAB — POCT URINALYSIS DIPSTICK
BILIRUBIN UA: NEGATIVE
Blood, UA: NEGATIVE
Glucose, UA: NEGATIVE
Ketones, UA: NEGATIVE
LEUKOCYTES UA: NEGATIVE
NITRITE UA: NEGATIVE
PROTEIN UA: NEGATIVE
Urobilinogen, UA: NEGATIVE

## 2016-04-08 NOTE — Progress Notes (Signed)
Reviewed personally.  M. Suzanne Antjuan Rothe, MD.  

## 2016-11-19 DIAGNOSIS — R7301 Impaired fasting glucose: Secondary | ICD-10-CM | POA: Diagnosis not present

## 2016-11-19 DIAGNOSIS — E069 Thyroiditis, unspecified: Secondary | ICD-10-CM | POA: Diagnosis not present

## 2016-11-19 DIAGNOSIS — D72819 Decreased white blood cell count, unspecified: Secondary | ICD-10-CM | POA: Diagnosis not present

## 2016-11-27 DIAGNOSIS — R7301 Impaired fasting glucose: Secondary | ICD-10-CM | POA: Diagnosis not present

## 2016-11-27 DIAGNOSIS — E069 Thyroiditis, unspecified: Secondary | ICD-10-CM | POA: Diagnosis not present

## 2016-12-28 ENCOUNTER — Other Ambulatory Visit: Payer: Self-pay | Admitting: Family Medicine

## 2016-12-28 DIAGNOSIS — Z1231 Encounter for screening mammogram for malignant neoplasm of breast: Secondary | ICD-10-CM

## 2017-01-15 DIAGNOSIS — R202 Paresthesia of skin: Secondary | ICD-10-CM | POA: Diagnosis not present

## 2017-01-15 DIAGNOSIS — E0590 Thyrotoxicosis, unspecified without thyrotoxic crisis or storm: Secondary | ICD-10-CM | POA: Diagnosis not present

## 2017-01-15 DIAGNOSIS — E559 Vitamin D deficiency, unspecified: Secondary | ICD-10-CM | POA: Diagnosis not present

## 2017-01-15 DIAGNOSIS — R7303 Prediabetes: Secondary | ICD-10-CM | POA: Diagnosis not present

## 2017-01-15 DIAGNOSIS — Z0000 Encounter for general adult medical examination without abnormal findings: Secondary | ICD-10-CM | POA: Diagnosis not present

## 2017-01-19 ENCOUNTER — Ambulatory Visit
Admission: RE | Admit: 2017-01-19 | Discharge: 2017-01-19 | Disposition: A | Discharge status: Home / Self Care | Payer: 59 | Source: Ambulatory Visit | Attending: Family Medicine | Admitting: Family Medicine

## 2017-01-19 DIAGNOSIS — Z1231 Encounter for screening mammogram for malignant neoplasm of breast: Secondary | ICD-10-CM

## 2017-02-05 ENCOUNTER — Institutional Professional Consult (permissible substitution): Payer: 59 | Admitting: Neurology

## 2017-02-05 ENCOUNTER — Telehealth: Payer: Self-pay

## 2017-02-05 NOTE — Telephone Encounter (Signed)
Pt no showed her appt today

## 2017-02-11 ENCOUNTER — Encounter: Payer: Self-pay | Admitting: Neurology

## 2017-03-03 DIAGNOSIS — Z0289 Encounter for other administrative examinations: Secondary | ICD-10-CM

## 2017-04-09 ENCOUNTER — Ambulatory Visit (INDEPENDENT_AMBULATORY_CARE_PROVIDER_SITE_OTHER): Payer: 59 | Admitting: Certified Nurse Midwife

## 2017-04-09 ENCOUNTER — Encounter: Payer: Self-pay | Admitting: Certified Nurse Midwife

## 2017-04-09 VITALS — BP 90/60 | HR 68 | Resp 16 | Ht 65.5 in | Wt 154.0 lb

## 2017-04-09 DIAGNOSIS — Z01419 Encounter for gynecological examination (general) (routine) without abnormal findings: Secondary | ICD-10-CM | POA: Diagnosis not present

## 2017-04-09 DIAGNOSIS — Z1211 Encounter for screening for malignant neoplasm of colon: Secondary | ICD-10-CM

## 2017-04-09 NOTE — Progress Notes (Signed)
51 y.o. [id number] Married  African American Fe here for annual exam. Periods normal, no issues. Seeing Dr. Chalmers Cater for hyperthyroid management, all stable. Now being evaluated for neuropathy in hands. Sees PCP yearly due soon for aex and labs. Just flew,in from [place] this am after vacation with family. Has "been a good year". Still using Olive oil for vaginal dryness with good results. No health issues today.  Patient's last menstrual period was 03/26/2017 (exact date).          Sexually active: Yes.    The current method of family planning is tubal ligation.    Exercising: Yes.    weight training, cardio Smoker:  no  Health Maintenance: Pap:  04-05-15 neg HPV HR neg History of Abnormal Pap: no MMG:  01-19-17 category d density birads 1:neg Self Breast exams: occ Colonoscopy: none BMD:   none TDaP: UTD Shingles: no Pneumonia: no Hep C and HIV: HIV neg 2015 Labs: none   reports that she has never smoked. She has never used smokeless tobacco. She reports that she does not drink alcohol or use drugs.  Past Medical History:  Diagnosis Date  . Allergic rhinitis, cause unspecified   . Chest pain, unspecified   . Dysmenorrhea   . Fibrocystic breast   . Hyperthyroidism    reported as borderline by Dr. Vashti Hey  . Impaired fasting glucose   . Leukocytopenia, unspecified   . Palpitations   . Subacute thyroiditis   . Tinnitus   . Unspecified hereditary and idiopathic peripheral neuropathy     Past Surgical History:  Procedure Laterality Date  . BREAST BIOPSY  10/11/2009   for fibrocystic breast times 2  . BREAST EXCISIONAL BIOPSY Left 2003   benign  . BREAST EXCISIONAL BIOPSY Right 1993   benign  . CESAREAN SECTION     times 2  . TONSILLECTOMY    . TUBAL LIGATION      Current Outpatient Prescriptions  Medication Sig Dispense Refill  . albuterol (PROVENTIL HFA;VENTOLIN HFA) 108 (90 BASE) MCG/ACT inhaler Inhale 1 puff into the lungs every 6 (six) hours as needed for  wheezing or shortness of breath.    . Calcium Acetate, Phos Binder, (CALCIUM ACETATE PO) Take 600 mg by mouth daily.     . cetirizine (ZYRTEC) 10 MG tablet Take 10 mg by mouth daily. Taking 1/2 tablet daily    . Cholecalciferol (VITAMIN D PO) Take by mouth daily.    . montelukast (SINGULAIR) 10 MG tablet Take 10 mg by mouth at bedtime.    . Multiple Vitamins-Minerals (MULTIVITAMIN PO) Take by mouth as needed.    . SUMAtriptan (IMITREX) 100 MG tablet Take 100 mg by mouth every 2 (two) hours as needed for migraine or headache. May repeat in 2 hours if headache persists or recurs.    . Omega-3 Fatty Acids (FISH OIL PO) Take 1,400 mg by mouth 3 times/day as needed-between meals & bedtime.      No current facility-administered medications for this visit.     Family History  Problem Relation Age of Onset  . Heart murmur Maternal Aunt   . Diabetes Maternal Grandmother   . Hypertension Maternal Grandmother   . Stroke Maternal Grandmother   . Migraines Maternal Grandmother   . Heart attack Paternal Grandfather   . Heart murmur Maternal Aunt   . Heart murmur Daughter   . Heart murmur Sister     ROS:  Pertinent items are noted in HPI.  Otherwise, a comprehensive ROS  was negative.  Exam:   BP 90/60   Pulse 68   Resp 16   Ht 5' 5.5" (1.664 m)   Wt 154 lb (69.9 kg)   LMP 03/26/2017 (Exact Date)   BMI 25.24 kg/m  Height: 5' 5.5" (166.4 cm) Ht Readings from Last 3 Encounters:  04/09/17 5' 5.5" (1.664 m)  04/07/16 5' 5.25" (1.657 m)  12/18/15 5\' 5"  (1.651 m)    General appearance: alert, cooperative and appears stated age Head: Normocephalic, without obvious abnormality, atraumatic Neck: no adenopathy, supple, symmetrical, trachea midline and thyroid normal to inspection and palpation Lungs: clear to auscultation bilaterally Breasts: normal appearance, no masses or tenderness, No nipple retraction or dimpling, No nipple discharge or bleeding, No axillary or supraclavicular adenopathy,  fibrocystic feel bilateral Heart: regular rate and rhythm Abdomen: soft, non-tender; no masses,  no organomegaly Extremities: extremities normal, atraumatic, no cyanosis or edema Skin: Skin color, texture, turgor normal. No rashes or lesions Lymph nodes: Cervical, supraclavicular, and axillary nodes normal. No abnormal inguinal nodes palpated Neurologic: Grossly normal   Pelvic: External genitalia:  no lesions              Urethra:  normal appearing urethra with no masses, tenderness or lesions              Bartholin's and Skene's: normal                 Vagina: normal appearing vagina with normal color and discharge, no lesions              Cervix: multiparous appearance, no cervical motion tenderness and no lesions              Pap taken: No. Bimanual Exam:  Uterus:  normal size, contour, position, consistency, mobility, non-tender              Adnexa: normal adnexa and no mass, fullness, tenderness               Rectovaginal: Confirms               Anus:  normal sphincter tone, no lesions, small hemorrhoidal tag noted, no thrombosis  Chaperone present: yes  A:  Well Woman with normal exam  Contraception  Tubal  Vaginal dryness using Olive Oil with good response  Hyperthyroid with Endocrine management  Hand Neuropathy under evaluation  Colonoscopy due    P:   Reviewed health and wellness pertinent to exam  Continue Olive oil use prn dryness, advise if issues  Continue follow up with MD as indicated  Discussed risks/benefits of colonoscopy, requests referral. Patient will be called with referral information  Pap smear: no   counseled on breast self exam, mammography screening, adequate intake of calcium and vitamin D, diet and exercise  return annually or prn  An After Visit Summary was printed and given to the patient.

## 2017-04-09 NOTE — Patient Instructions (Signed)

## 2017-04-12 ENCOUNTER — Encounter: Payer: Self-pay | Admitting: Neurology

## 2017-04-12 ENCOUNTER — Ambulatory Visit (INDEPENDENT_AMBULATORY_CARE_PROVIDER_SITE_OTHER): Payer: 59 | Admitting: Neurology

## 2017-04-12 VITALS — BP 103/70 | HR 74 | Ht 65.5 in | Wt 152.4 lb

## 2017-04-12 DIAGNOSIS — G5603 Carpal tunnel syndrome, bilateral upper limbs: Secondary | ICD-10-CM

## 2017-04-12 MED ORDER — GABAPENTIN 300 MG PO CAPS: 300.0000 mg | ORAL_CAPSULE | Freq: Every day | ORAL | 3 refills | Status: DC

## 2017-04-12 NOTE — Patient Instructions (Addendum)
Carpal Tunnel Syndrome Carpal tunnel syndrome is a condition that causes pain in your hand and arm. The carpal tunnel is a narrow area located on the palm side of your wrist. Repeated wrist motion or certain diseases may cause swelling within the tunnel. This swelling pinches the main nerve in the wrist (median nerve). What are the causes? This condition may be caused by:  Repeated wrist motions.  Wrist injuries.  Arthritis.  A cyst or tumor in the carpal tunnel.  Fluid buildup during pregnancy.  Sometimes the cause of this condition is not known. What increases the risk? This condition is more likely to develop in:  People who have jobs that cause them to repeatedly move their wrists in the same motion, such as Art gallery manager.  Women.  People with certain conditions, such as: ? Diabetes. ? Obesity. ? An underactive thyroid (hypothyroidism). ? Kidney failure.  What are the signs or symptoms? Symptoms of this condition include:  A tingling feeling in your fingers, especially in your thumb, index, and middle fingers.  Tingling or numbness in your hand.  An aching feeling in your entire arm, especially when your wrist and elbow are bent for long periods of time.  Wrist pain that goes up your arm to your shoulder.  Pain that goes down into your palm or fingers.  A weak feeling in your hands. You may have trouble grabbing and holding items.  Your symptoms may feel worse during the night. How is this diagnosed? This condition is diagnosed with a medical history and physical exam. You may also have tests, including:  An electromyogram (EMG). This test measures electrical signals sent by your nerves into the muscles.  X-rays.  How is this treated? Treatment for this condition includes:  Lifestyle changes. It is important to stop doing or modify the activity that caused your condition.  Physical or occupational therapy.  Medicines for pain and inflammation.  This may include medicine that is injected into your wrist.  A wrist splint.  Surgery.  Follow these instructions at home: If you have a splint:  Wear it as told by your health care provider. Remove it only as told by your health care provider.  Loosen the splint if your fingers become numb and tingle, or if they turn cold and blue.  Keep the splint clean and dry. General instructions  Take over-the-counter and prescription medicines only as told by your health care provider.  Rest your wrist from any activity that may be causing your pain. If your condition is work related, talk to your employer about changes that can be made, such as getting a wrist pad to use while typing.  If directed, apply ice to the painful area: ? Put ice in a plastic bag. ? Place a towel between your skin and the bag. ? Leave the ice on for 20 minutes, 2-3 times per day.  Keep all follow-up visits as told by your health care provider. This is important.  Do any exercises as told by your health care provider, physical therapist, or occupational therapist. Contact a health care provider if:  You have new symptoms.  Your pain is not controlled with medicines.  Your symptoms get worse. This information is not intended to replace advice given to you by your health care provider. Make sure you discuss any questions you have with your health care provider. Document Released: 08/21/2000 Document Revised: 01/02/2016 Document Reviewed: 01/09/2015 Elsevier Interactive Patient Education  2017 [employer]  Gabapentin capsules  or tablets What is this medicine? GABAPENTIN (GA ba pen tin) is used to control partial seizures in adults with epilepsy. It is also used to treat certain types of nerve pain. This medicine may be used for other purposes; ask your health care provider or pharmacist if you have questions. COMMON BRAND NAME(S): Active-PAC with Gabapentin, Gabarone, Neurontin What should I tell my health  care provider before I take this medicine? They need to know if you have any of these conditions: -kidney disease -suicidal thoughts, plans, or attempt; a previous suicide attempt by you or a family member -an unusual or allergic reaction to gabapentin, other medicines, foods, dyes, or preservatives -pregnant or trying to get pregnant -breast-feeding How should I use this medicine? Take this medicine by mouth with a glass of water. Follow the directions on the prescription label. You can take it with or without food. If it upsets your stomach, take it with food.Take your medicine at regular intervals. Do not take it more often than directed. Do not stop taking except on your doctor's advice. If you are directed to break the 600 or 800 mg tablets in half as part of your dose, the extra half tablet should be used for the next dose. If you have not used the extra half tablet within 28 days, it should be thrown away. A special MedGuide will be given to you by the pharmacist with each prescription and refill. Be sure to read this information carefully each time. Talk to your pediatrician regarding the use of this medicine in children. Special care may be needed. Overdosage: If you think you have taken too much of this medicine contact a poison control center or emergency room at once. NOTE: This medicine is only for you. Do not share this medicine with others. What if I miss a dose? If you miss a dose, take it as soon as you can. If it is almost time for your next dose, take only that dose. Do not take double or extra doses. What may interact with this medicine? Do not take this medicine with any of the following medications: -other gabapentin products This medicine may also interact with the following medications: -alcohol -antacids -antihistamines for allergy, cough and cold -certain medicines for anxiety or sleep -certain medicines for depression or psychotic disturbances -homatropine;  hydrocodone -naproxen -narcotic medicines (opiates) for pain -phenothiazines like chlorpromazine, mesoridazine, prochlorperazine, thioridazine This list may not describe all possible interactions. Give your health care provider a list of all the medicines, herbs, non-prescription drugs, or dietary supplements you use. Also tell them if you smoke, drink alcohol, or use illegal drugs. Some items may interact with your medicine. What should I watch for while using this medicine? Visit your doctor or health care professional for regular checks on your progress. You may want to keep a record at home of how you feel your condition is responding to treatment. You may want to share this information with your doctor or health care professional at each visit. You should contact your doctor or health care professional if your seizures get worse or if you have any new types of seizures. Do not stop taking this medicine or any of your seizure medicines unless instructed by your doctor or health care professional. Stopping your medicine suddenly can increase your seizures or their severity. Wear a medical identification bracelet or chain if you are taking this medicine for seizures, and carry a card that lists all your medications. You may get drowsy, dizzy, or  have blurred vision. Do not drive, use machinery, or do anything that needs mental alertness until you know how this medicine affects you. To reduce dizzy or fainting spells, do not sit or stand up quickly, especially if you are an older patient. Alcohol can increase drowsiness and dizziness. Avoid alcoholic drinks. Your mouth may get dry. Chewing sugarless gum or sucking hard candy, and drinking plenty of water will help. The use of this medicine may increase the chance of suicidal thoughts or actions. Pay special attention to how you are responding while on this medicine. Any worsening of mood, or thoughts of suicide or dying should be reported to your health  care professional right away. Women who become pregnant while using this medicine may enroll in the [place] Pregnancy Registry by calling [phone]. This registry collects information about the safety of antiepileptic drug use during pregnancy. What side effects may I notice from receiving this medicine? Side effects that you should report to your doctor or health care professional as soon as possible: -allergic reactions like skin rash, itching or hives, swelling of the face, lips, or tongue -worsening of mood, thoughts or actions of suicide or dying Side effects that usually do not require medical attention (report to your doctor or health care professional if they continue or are bothersome): -constipation -difficulty walking or controlling muscle movements -dizziness -nausea -slurred speech -tiredness -tremors -weight gain This list may not describe all possible side effects. Call your doctor for medical advice about side effects. You may report side effects to FDA at 1-800-FDA-1088. Where should I keep my medicine? Keep out of reach of children. This medicine may cause accidental overdose and death if it taken by other adults, children, or pets. Mix any unused medicine with a substance like cat litter or coffee grounds. Then throw the medicine away in a sealed container like a sealed bag or a coffee can with a lid. Do not use the medicine after the expiration date. Store at room temperature between 15 and 30 degrees C (59 and 86 degrees F). NOTE: This sheet is a summary. It may not cover all possible information. If you have questions about this medicine, talk to your doctor, pharmacist, or health care provider.  2018 Elsevier/Gold Standard (2013-10-20 15:26:50)

## 2017-04-12 NOTE — Progress Notes (Signed)
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 EMG nerve conduction study in 2015 showed L5-S1 radiculopathy without polyneuropathy, epidural nerve density biopsy for small fiber neuropathy was normal. Complete workup including labs for vasculitis, Lyme, cryoglobulin, hep C, IFE and PE, ANA, sedimentation rate, HIV, rheumatoid factor, angiotensin converting enzyme were all negative.   The back pain has improved. Radiculopathy improved but still has it.  Hands are worsening. It is the palms of the hands. Burning in the hands. Fingers are numb. More aware/worse in the middle of the night and when driving. This started worsening several months ago and also endorses weakness, no inciting events. Continuous all day and worse in the middle of the night and when driving. Can be moderately painful, No other focal neurologic deficits, associated symptoms, inciting events or modifiable factors.  CMP 4/17 normal  Personally reviewed images and agree with the following: Abnormal MRI lumbar spine (without) demonstrating: 1. At L4-5: posterior central disc protrusion, facet hypertrophy, with mild-moderate right and moderate left foraminal stenosis, right and left lateral recess stenosis with potential impingement upon the descending L5 nerve roots. 2. At L5-S1: facet hypertrophy with mild biforaminal narrowing    HPI:  Suzanne Anderson is a 51 y.o. female PMHx of hyperhtyroidism, diabetes and headaches who is here as a referral from Dr. Drema Dallas for Peripheral Neuropathy.  She describes burning in the bottom of the feet, fingers and palms. Started in  the bottom of the feet. Started last year. Worsening, progressing. Worse at night especially the palms. Better with drinking water. At night can't have the sheets touch the hands. Not really painful just uncomfortable and interrupts her doing things like opening bottles of water. The heaaches are daily,  Pressure in the temples. The migraines can't funtion, pounding 10/10, light sensitivity, phonophobia, onemonthly and she always gets them with her period. Perfume triggers it. Triptan helps but takes it when severe. Has sparkly lights, flashes before then and gets "befuzzled". Last hours to days. Learning to handle it better with diet. The daily pressure headaches get to about a 5-6/10 and takes naps to help, no OTC medications. Sleeps "ok" but the headaches wake her up. Both types started in 2010. Grandmother had migraines and used to pour alcohol on her head to try and make it better. First had migraines at the age of 89. Has had status migrainosus.   Denies muscle cramps. Balance is fine. No falls. Denies weakness. Has low back pain that radiates down the lateral part of the leg, like a shock. Unclear when the shocks happens, has soreness there all the time. Also has non-stop ringing in the ear, evaluation by ENT was negative. No focal neuro symptoms with headaches.   Reviewed notes, labs and imaging from outside physicians, which showed HgA1c 5.8, eGFR 100, B12 1648.   CC:  Paresthesias  History: Suzanne Anderson is a 51 y.o. female PMHx of hyperthyroidism, glucose intolerance and headaches who is here as a referral from Dr. Drema Dallas for paresthesias. She describes burning in the bottom of the feet, fingers and palms. Symptoms started in the bottom of the feet. Started last year. Worsening, progressing. Worse at night  especially the palms. At night can't have the sheets touch the hands. Has low back pain with radicular symptoms. Focused lower extremity exam demonstrates 5/5 strength and intact  sensation.    Summary: Nerve Conduction studies were performed on the upper and lower extremities.  Bilateral Ulnar and Median motor conductions were within normal limits.   Bilateral Median and Ulnar sensory conductions were within normal limits.  Right lower extremity Peroneal and Tibial motor conductions were within normal limits.   Right lower extremity Peroneal sensory conductions were within normal limits.    All F Wave latencies were within normal limits.    All H Reflex latencies were within normal limits.    EMG needle evaluation was performed on selected lower extremity muscles. EMG of the right Medial Gastrocnemius, right Extensor Hallucis Longus, right Biceps Femoris(long head) and L5/S1 paraspinal muscles showed increased spontaneous activity.  The following muscles were normal: right Vastus Medialis, right Anterior Tibialis, right Gluteus Minimus, right Gluteus Maximus.   Conclusion: This is an abnormal study.   1. The acute/ongoing denervation in right leg muscles that are supplied by the L5/S1 nerve roots in conjunction with right lower lumbar paraspinal spontaneous activity and normal sensory conductions suggest a right L5/S1 radiculopathy.   2. Denervating potentials in the left lower paraspinal muscles are also consistent with left radiculopathy however the level can not be determined by paraspinals alone.  3. No electrophysiologic evidence for ulnar or median neuropathy or polyneuropathy. This test does not evaluate for small-fiber neuropathy. Suggest skin biopsy as clinically warranted.   Review of Systems: Patient complains of symptoms per HPI as well as the following symptoms:no CP, no SOB. Pertinent negatives and positives per HPI. All others negative.   Social History   Social History  . Marital status: Married    Spouse name: N/A  . Number of children: 2  . Years of education: Associate   Occupational History  . Stay at home mom    Social History  Main Topics  . Smoking status: Never Smoker  . Smokeless tobacco: Never Used  . Alcohol use No  . Drug use: No  . Sexual activity: Yes    Partners: Male    Birth control/ protection: Surgical     Comment: tubal ligation   Other Topics Concern  . Not on file   Social History Narrative   Patient is married with 2 children.   Patient is right handed.   Patient has an Associate's degree.   Patient does not drink any caffeine.    Family History  Problem Relation Age of Onset  . Heart murmur Maternal Aunt   . Diabetes Maternal Grandmother   . Hypertension Maternal Grandmother   . Stroke Maternal Grandmother   . Migraines Maternal Grandmother   . Heart attack Paternal Grandfather   . Heart murmur Maternal Aunt   . Heart murmur Daughter   . Heart murmur Sister     Past Medical History:  Diagnosis Date  . Allergic rhinitis, cause unspecified   . Chest pain, unspecified   . Dysmenorrhea   . Fibrocystic breast   . Hyperthyroidism    reported as borderline by Dr. Vashti Hey  . Impaired fasting glucose   . Leukocytopenia, unspecified   . Palpitations   . Subacute thyroiditis   . Tinnitus   . Unspecified hereditary and idiopathic peripheral neuropathy     Past Surgical History:  Procedure Laterality Date  . BREAST BIOPSY  10/11/2009   for fibrocystic breast times  2  . BREAST EXCISIONAL BIOPSY Left 2003   benign  . BREAST EXCISIONAL BIOPSY Right 1993   benign  . CESAREAN SECTION     times 2  . TONSILLECTOMY    . TUBAL LIGATION      Current Outpatient Prescriptions  Medication Sig Dispense Refill  . Calcium Acetate, Phos Binder, (CALCIUM ACETATE PO) Take 600 mg by mouth daily.     . Cholecalciferol (VITAMIN D PO) Take by mouth daily.    . Multiple Vitamins-Minerals (MULTIVITAMIN PO) Take by mouth as needed.    . Omega-3 Fatty Acids (FISH OIL PO) Take 1,400 mg by mouth 3 times/day as needed-between meals & bedtime.     Marland Kitchen albuterol (PROVENTIL HFA;VENTOLIN HFA) 108  (90 BASE) MCG/ACT inhaler Inhale 1 puff into the lungs every 6 (six) hours as needed for wheezing or shortness of breath.    . cetirizine (ZYRTEC) 10 MG tablet Take 10 mg by mouth daily as needed. Taking 1/2 tablet daily    . gabapentin (NEURONTIN) 300 MG capsule Take 1 capsule (300 mg total) by mouth at bedtime. 30 capsule 3  . montelukast (SINGULAIR) 10 MG tablet Take 10 mg by mouth at bedtime as needed.     . SUMAtriptan (IMITREX) 100 MG tablet Take 100 mg by mouth every 2 (two) hours as needed for migraine or headache. May repeat in 2 hours if headache persists or recurs.     No current facility-administered medications for this visit.     Allergies as of 04/12/2017 - Review Complete 04/12/2017  Allergen Reaction Noted  . Flexeril [cyclobenzaprine]  02/20/2014  . Peanuts [peanut oil] Nausea And Vomiting 11/18/2013  . Terazol [terconazole] Swelling   . Vicodin [hydrocodone-acetaminophen]  02/20/2014  . Monistat [miconazole] Rash     Vitals: BP 103/70   Pulse 74   Ht 5' 5.5" (1.664 m)   Wt 152 lb 6.4 oz (69.1 kg)   BMI 24.97 kg/m  Last Weight:  Wt Readings from Last 1 Encounters:  04/12/17 152 lb 6.4 oz (69.1 kg)   Last Height:   Ht Readings from Last 1 Encounters:  04/12/17 5' 5.5" (1.664 m)   Physical exam: Exam: Gen: NAD, conversant, well nourised, well groomed                     CV: RRR, no MRG. No Carotid Bruits. No peripheral edema, warm, nontender Eyes: Conjunctivae clear without exudates or hemorrhage  Neuro: Detailed Neurologic Exam  Speech:    Speech is normal; fluent and spontaneous with normal comprehension.  Cognition:    The patient is oriented to person, place, and time;     recent and remote memory intact;     language fluent;     normal attention, concentration,     fund of knowledge Cranial Nerves:    The pupils are equal, round, and reactive to light. The fundi are normal and spontaneous venous pulsations are present. Visual fields are full to  finger confrontation. Extraocular movements are intact. Trigeminal sensation is intact and the muscles of mastication are normal. The face is symmetric. The palate elevates in the midline. Hearing intact. Voice is normal. Shoulder shrug is normal. The tongue has normal motion without fasciculations.   Coordination:    Normal finger to nose and heel to shin. Normal rapid alternating movements.   Gait:    Heel-toe and tandem gait are normal.   Motor Observation:    No asymmetry, no atrophy, and no involuntary  movements noted. Tone:    Normal muscle tone.    Posture:    Posture is normal. normal erect    Strength:    Strength is V/V in the upper and lower limbs.      Sensation: intact to LT     Reflex Exam:  DTR's:    Deep tendon reflexes in the upper and lower extremities are normal bilaterally.   Toes:    The toes are downgoing bilaterally.   Clonus:    Clonus is absent.     Assessment:  51 year old female with glucose intolerance and a PMHx of migraines who is here for evaluation of new pain in the hands. She also has chronic migraines and lumbar radiculopathy. EMG/NCS bilateral upper extremities to evaluate for carpal tunnel syndrome or other mononeuropathy, polyneuropathy, cervical radiculopathy.  Orders Placed This Encounter  Procedures  . NCV with EMG(electromyography)   Cc: Dr. Marcy Siren, [facility] [address] [place], [place] [address]  Phone [phone] Fax [phone]

## 2017-04-27 DIAGNOSIS — Z1211 Encounter for screening for malignant neoplasm of colon: Secondary | ICD-10-CM | POA: Diagnosis not present

## 2017-05-06 ENCOUNTER — Ambulatory Visit (INDEPENDENT_AMBULATORY_CARE_PROVIDER_SITE_OTHER): Payer: Self-pay | Admitting: Neurology

## 2017-05-06 ENCOUNTER — Ambulatory Visit (INDEPENDENT_AMBULATORY_CARE_PROVIDER_SITE_OTHER): Payer: 59 | Admitting: Neurology

## 2017-05-06 DIAGNOSIS — G5603 Carpal tunnel syndrome, bilateral upper limbs: Secondary | ICD-10-CM

## 2017-05-06 DIAGNOSIS — Z0289 Encounter for other administrative examinations: Secondary | ICD-10-CM

## 2017-05-06 DIAGNOSIS — G5600 Carpal tunnel syndrome, unspecified upper limb: Secondary | ICD-10-CM

## 2017-05-06 NOTE — Procedures (Signed)
Full Name: Suzanne Anderson Gender: Female MRN #:  [id number] Date of Birth: [birth year]    Visit Date: 05/06/2017 09:07 Age: 51 Years 17 Months Old Examining Physician: Sarina Ill, MD  Referring Physician: Leighton Ruff, MD (CCed)  History: EMG/NCS bilateral upper extremities to evaluate for carpal tunnel syndrome or other mononeuropathy, polyneuropathy, cervical radiculopathy  Summary:  The right median/ulnar (palm) comparison nerve showed prolonged distal peak latency (Median Palm, 2.4 ms, N<2.2) and borderline-abnormal peak latency difference (Median Palm-Ulnar Palm, 0.4 ms, N<0.4) with a relative median delay. All remaining nerves (as detailed in the following tables) were within normal limits.All muscles (as detailed in the following tables) were within normal limits.    Conclusion: There is mild right carpal tunnel syndrome. Otherwise normal exam.  Sarina Ill M.D.  [facility] [place], [place] [address] Tel: [phone] Fax: [phone]        [facility]    Nerve / Sites Muscle Latency Ref. Amplitude Ref. Rel Amp Segments Distance Velocity Ref. Area    ms ms mV mV %  cm m/s m/s mVms  R Median - APB     Wrist APB 4.0 ?4.4 8.9 ?4.0 100 Wrist - APB 7   30.6     Upper arm APB 8.5  8.0  90.1 Upper arm - Wrist 22 49 ?49 29.5  L Median - APB     Wrist APB 3.7 ?4.4 8.6 ?4.0 100 Wrist - APB 7   27.7     Upper arm APB 7.9  7.9  91.7 Upper arm - Wrist 22 52 ?49 27.0  R Ulnar - ADM     Wrist ADM 2.6 ?3.3 7.4 ?6.0 100 Wrist - ADM 7   26.5     B.Elbow ADM 6.6  7.1  95 B.Elbow - Wrist 20 50 ?49 26.8     A.Elbow ADM 8.9  6.7  94.5 A.Elbow - B.Elbow 12 52 ?49 26.0         A.Elbow - Wrist      L Ulnar - ADM     Wrist ADM 3.0 ?3.3 7.4 ?6.0 100 Wrist - ADM 7   26.5     B.Elbow ADM 6.8  6.7  90.4 B.Elbow - Wrist 20 53 ?49 26.3     A.Elbow ADM 9.0  6.5  98.4 A.Elbow - B.Elbow 12 54 ?49 25.0         A.Elbow - Wrist                 SNC     Nerve / Sites Rec. Site Peak Lat Ref.  Amp Ref. Segments Distance Peak Diff Ref.    ms ms V V  cm ms ms  R Radial - Anatomical snuff box (Forearm)     Forearm Wrist 2.5 ?2.9 17 ?15 Forearm - Wrist 10    L Radial - Anatomical snuff box (Forearm)     Forearm Wrist 2.7 ?2.9 19 ?15 Forearm - Wrist 10    R Median, Ulnar - Transcarpal comparison     Median Palm Wrist 2.4 ?2.2 38 ?35 Median Palm - Wrist 8       Ulnar Palm Wrist 2.1 ?2.2 19 ?12 Ulnar Palm - Wrist 8          Median Palm - Ulnar Palm  0.4 ?0.4  L Median, Ulnar - Transcarpal comparison     Median Palm Wrist 2.3 ?2.2 39 ?35 Median Palm - Wrist 8  Ulnar Palm Wrist 2.0 ?2.2 33 ?12 Ulnar Palm - Wrist 8          Median Palm - Ulnar Palm  0.3 ?0.4  R Median - Orthodromic (Dig II, Mid palm)     Dig II Wrist 3.6 ?3.4 13 ?10 Dig II - Wrist 13    L Median - Orthodromic (Dig II, Mid palm)     Dig II Wrist 3.3 ?3.4 23 ?10 Dig II - Wrist 13    R Ulnar - Orthodromic, (Dig V, Mid palm)     Dig V Wrist 3.0 ?3.1 17 ?5 Dig V - Wrist 11    L Ulnar - Orthodromic, (Dig V, Mid palm)     Dig V Wrist 2.9 ?3.1 14 ?5 Dig V - Wrist 49                       F  Wave    Nerve F Lat Ref.   ms ms  R Ulnar - ADM 29.4 ?32.0  L Ulnar - ADM 29.9 ?32.0         EMG full       EMG Summary Table    Spontaneous MUAP Recruitment  Muscle IA Fib PSW Fasc Other Amp Dur. Poly Pattern  R. Deltoid Normal None None None _______ Normal Normal Normal Normal  L. Triceps brachii Normal None None None _______ Normal Normal Normal Normal  R. Triceps brachii Normal None None None _______ Normal Normal Normal Normal  L. Deltoid Normal None None None _______ Normal Normal Normal Normal  L. Pronator teres Normal None None None _______ Normal Normal Normal Normal  R. Pronator teres Normal None None None _______ Normal Normal Normal Normal  L. First dorsal interosseous Normal None None None _______ Normal Normal Normal Normal  R. First dorsal interosseous Normal None  None None _______ Normal Normal Normal Normal  L. Opponens pollicis Normal None None None _______ Normal Normal Normal Normal  R. Opponens pollicis Normal None None None _______ Normal Normal Normal Normal  L. Cervical paraspinals (low) Normal None None None _______ Normal Normal Normal Normal  R. Cervical paraspinals (low) Normal None None None _______ Normal Normal Normal Normal

## 2017-05-06 NOTE — Progress Notes (Signed)
See procedure note.

## 2017-05-06 NOTE — Progress Notes (Signed)
Full Name: Suzanne Anderson Gender: Female MRN #:  [id number] Date of Birth: Jan 08, 1966    Visit Date: 05/06/2017 09:07 Age: 51 Years 66 Months Old Examining Physician: Sarina Ill, MD  Referring Physician: Leighton Ruff, MD (CCed)  History: EMG/NCS bilateral upper extremities to evaluate for carpal tunnel syndrome or other mononeuropathy, polyneuropathy, cervical radiculopathy  Summary:  The right median/ulnar (palm) comparison nerve showed prolonged distal peak latency (Median Palm, 2.4 ms, N<2.2) and borderline-abnormal peak latency difference (Median Palm-Ulnar Palm, 0.4 ms, N<0.4) with a relative median delay. All remaining nerves (as detailed in the following tables) were within normal limits.All muscles (as detailed in the following tables) were within normal limits.    Conclusion: There is mild right carpal tunnel syndrome. Otherwise normal exam.  Sarina Ill M.D.  [facility] [place], [place] [address] Tel: [phone] Fax: [phone]        [facility]    Nerve / Sites Muscle Latency Ref. Amplitude Ref. Rel Amp Segments Distance Velocity Ref. Area    ms ms mV mV %  cm m/s m/s mVms  R Median - APB     Wrist APB 4.0 ?4.4 8.9 ?4.0 100 Wrist - APB 7   30.6     Upper arm APB 8.5  8.0  90.1 Upper arm - Wrist 22 49 ?49 29.5  L Median - APB     Wrist APB 3.7 ?4.4 8.6 ?4.0 100 Wrist - APB 7   27.7     Upper arm APB 7.9  7.9  91.7 Upper arm - Wrist 22 52 ?49 27.0  R Ulnar - ADM     Wrist ADM 2.6 ?3.3 7.4 ?6.0 100 Wrist - ADM 7   26.5     B.Elbow ADM 6.6  7.1  95 B.Elbow - Wrist 20 50 ?49 26.8     A.Elbow ADM 8.9  6.7  94.5 A.Elbow - B.Elbow 12 52 ?49 26.0         A.Elbow - Wrist      L Ulnar - ADM     Wrist ADM 3.0 ?3.3 7.4 ?6.0 100 Wrist - ADM 7   26.5     B.Elbow ADM 6.8  6.7  90.4 B.Elbow - Wrist 20 53 ?49 26.3     A.Elbow ADM 9.0  6.5  98.4 A.Elbow - B.Elbow 12 54 ?49 25.0         A.Elbow - Wrist                 SNC      Nerve / Sites Rec. Site Peak Lat Ref.  Amp Ref. Segments Distance Peak Diff Ref.    ms ms V V  cm ms ms  R Radial - Anatomical snuff box (Forearm)     Forearm Wrist 2.5 ?2.9 17 ?15 Forearm - Wrist 10    L Radial - Anatomical snuff box (Forearm)     Forearm Wrist 2.7 ?2.9 19 ?15 Forearm - Wrist 10    R Median, Ulnar - Transcarpal comparison     Median Palm Wrist 2.4 ?2.2 38 ?35 Median Palm - Wrist 8       Ulnar Palm Wrist 2.1 ?2.2 19 ?12 Ulnar Palm - Wrist 8          Median Palm - Ulnar Palm  0.4 ?0.4  L Median, Ulnar - Transcarpal comparison     Median Palm Wrist 2.3 ?2.2 39 ?35 Median Palm - Wrist 8  Ulnar Palm Wrist 2.0 ?2.2 33 ?12 Ulnar Palm - Wrist 8          Median Palm - Ulnar Palm  0.3 ?0.4  R Median - Orthodromic (Dig II, Mid palm)     Dig II Wrist 3.6 ?3.4 13 ?10 Dig II - Wrist 13    L Median - Orthodromic (Dig II, Mid palm)     Dig II Wrist 3.3 ?3.4 23 ?10 Dig II - Wrist 13    R Ulnar - Orthodromic, (Dig V, Mid palm)     Dig V Wrist 3.0 ?3.1 17 ?5 Dig V - Wrist 11    L Ulnar - Orthodromic, (Dig V, Mid palm)     Dig V Wrist 2.9 ?3.1 14 ?5 Dig V - Wrist 61                       F  Wave    Nerve F Lat Ref.   ms ms  R Ulnar - ADM 29.4 ?32.0  L Ulnar - ADM 29.9 ?32.0         EMG full       EMG Summary Table    Spontaneous MUAP Recruitment  Muscle IA Fib PSW Fasc Other Amp Dur. Poly Pattern  R. Deltoid Normal None None None _______ Normal Normal Normal Normal  L. Triceps brachii Normal None None None _______ Normal Normal Normal Normal  R. Triceps brachii Normal None None None _______ Normal Normal Normal Normal  L. Deltoid Normal None None None _______ Normal Normal Normal Normal  L. Pronator teres Normal None None None _______ Normal Normal Normal Normal  R. Pronator teres Normal None None None _______ Normal Normal Normal Normal  L. First dorsal interosseous Normal None None None _______ Normal Normal Normal Normal  R. First dorsal interosseous Normal None  None None _______ Normal Normal Normal Normal  L. Opponens pollicis Normal None None None _______ Normal Normal Normal Normal  R. Opponens pollicis Normal None None None _______ Normal Normal Normal Normal  L. Cervical paraspinals (low) Normal None None None _______ Normal Normal Normal Normal  R. Cervical paraspinals (low) Normal None None None _______ Normal Normal Normal Normal

## 2017-05-21 DIAGNOSIS — H9313 Tinnitus, bilateral: Secondary | ICD-10-CM | POA: Diagnosis not present

## 2017-05-21 DIAGNOSIS — H938X2 Other specified disorders of left ear: Secondary | ICD-10-CM | POA: Insufficient documentation

## 2017-05-21 DIAGNOSIS — H6983 Other specified disorders of Eustachian tube, bilateral: Secondary | ICD-10-CM | POA: Diagnosis not present

## 2017-05-21 DIAGNOSIS — H6982 Other specified disorders of Eustachian tube, left ear: Secondary | ICD-10-CM | POA: Insufficient documentation

## 2017-05-21 DIAGNOSIS — J343 Hypertrophy of nasal turbinates: Secondary | ICD-10-CM | POA: Diagnosis not present

## 2017-05-21 DIAGNOSIS — Z0110 Encounter for examination of ears and hearing without abnormal findings: Secondary | ICD-10-CM | POA: Diagnosis not present

## 2017-05-26 DIAGNOSIS — K6389 Other specified diseases of intestine: Secondary | ICD-10-CM | POA: Diagnosis not present

## 2017-05-26 DIAGNOSIS — D123 Benign neoplasm of transverse colon: Secondary | ICD-10-CM | POA: Diagnosis not present

## 2017-05-26 DIAGNOSIS — K635 Polyp of colon: Secondary | ICD-10-CM | POA: Diagnosis not present

## 2017-05-26 DIAGNOSIS — D120 Benign neoplasm of cecum: Secondary | ICD-10-CM | POA: Diagnosis not present

## 2017-05-26 DIAGNOSIS — Z1211 Encounter for screening for malignant neoplasm of colon: Secondary | ICD-10-CM | POA: Diagnosis not present

## 2017-07-16 DIAGNOSIS — Z23 Encounter for immunization: Secondary | ICD-10-CM | POA: Diagnosis not present

## 2017-11-22 DIAGNOSIS — E069 Thyroiditis, unspecified: Secondary | ICD-10-CM | POA: Diagnosis not present

## 2017-11-22 DIAGNOSIS — R7301 Impaired fasting glucose: Secondary | ICD-10-CM | POA: Diagnosis not present

## 2017-12-20 ENCOUNTER — Other Ambulatory Visit: Payer: Self-pay | Admitting: Family Medicine

## 2017-12-20 DIAGNOSIS — Z1231 Encounter for screening mammogram for malignant neoplasm of breast: Secondary | ICD-10-CM

## 2017-12-20 DIAGNOSIS — R7301 Impaired fasting glucose: Secondary | ICD-10-CM | POA: Diagnosis not present

## 2017-12-20 DIAGNOSIS — E069 Thyroiditis, unspecified: Secondary | ICD-10-CM | POA: Diagnosis not present

## 2018-01-24 ENCOUNTER — Ambulatory Visit
Admission: RE | Admit: 2018-01-24 | Discharge: 2018-01-24 | Disposition: A | Discharge status: Home / Self Care | Payer: 59 | Source: Ambulatory Visit | Attending: Family Medicine | Admitting: Family Medicine

## 2018-01-24 DIAGNOSIS — Z1231 Encounter for screening mammogram for malignant neoplasm of breast: Secondary | ICD-10-CM

## 2018-04-27 ENCOUNTER — Encounter: Payer: Self-pay | Admitting: Certified Nurse Midwife

## 2018-04-27 ENCOUNTER — Other Ambulatory Visit: Payer: Self-pay

## 2018-04-27 ENCOUNTER — Ambulatory Visit (INDEPENDENT_AMBULATORY_CARE_PROVIDER_SITE_OTHER): Payer: 59 | Admitting: Certified Nurse Midwife

## 2018-04-27 ENCOUNTER — Other Ambulatory Visit (HOSPITAL_COMMUNITY)
Admission: RE | Admit: 2018-04-27 | Discharge: 2018-04-27 | Disposition: A | Discharge status: Home / Self Care | Payer: 59 | Source: Ambulatory Visit | Attending: Obstetrics & Gynecology | Admitting: Obstetrics & Gynecology

## 2018-04-27 VITALS — BP 120/68 | HR 68 | Resp 16 | Ht 65.25 in | Wt 143.0 lb

## 2018-04-27 DIAGNOSIS — R591 Generalized enlarged lymph nodes: Secondary | ICD-10-CM | POA: Diagnosis not present

## 2018-04-27 DIAGNOSIS — Z634 Disappearance and death of family member: Secondary | ICD-10-CM | POA: Diagnosis not present

## 2018-04-27 DIAGNOSIS — Z01419 Encounter for gynecological examination (general) (routine) without abnormal findings: Secondary | ICD-10-CM | POA: Diagnosis not present

## 2018-04-27 DIAGNOSIS — Z124 Encounter for screening for malignant neoplasm of cervix: Secondary | ICD-10-CM | POA: Insufficient documentation

## 2018-04-27 DIAGNOSIS — Z01411 Encounter for gynecological examination (general) (routine) with abnormal findings: Secondary | ICD-10-CM

## 2018-04-27 DIAGNOSIS — R1032 Left lower quadrant pain: Secondary | ICD-10-CM | POA: Insufficient documentation

## 2018-04-27 DIAGNOSIS — E559 Vitamin D deficiency, unspecified: Secondary | ICD-10-CM | POA: Insufficient documentation

## 2018-04-27 DIAGNOSIS — K649 Unspecified hemorrhoids: Secondary | ICD-10-CM | POA: Insufficient documentation

## 2018-04-27 DIAGNOSIS — E0590 Thyrotoxicosis, unspecified without thyrotoxic crisis or storm: Secondary | ICD-10-CM | POA: Insufficient documentation

## 2018-04-27 NOTE — Patient Instructions (Addendum)
Lymphadenopathy Lymphadenopathy refers to swollen or enlarged lymph glands, also called lymph nodes. Lymph glands are part of your body's defense (immune) system, which protects the body from infections, germs, and diseases. Lymph glands are found in many locations in your body, including the neck, underarm, and groin. Many things can cause lymph glands to become enlarged. When your immune system responds to germs, such as viruses or bacteria, infection-fighting cells and fluid build up. This causes the glands to grow in size. Usually, this is not something to worry about. The swelling and any soreness often go away without treatment. However, swollen lymph glands can also be caused by a number of diseases. Your health care provider may do various tests to help determine the cause. If the cause of your swollen lymph glands cannot be found, it is important to monitor your condition to make sure the swelling goes away. Follow these instructions at home: Watch your condition for any changes. The following actions may help to lessen any discomfort you are feeling:  Get plenty of rest.  Take medicines only as directed by your health care provider. Your health care provider may recommend over-the-counter medicines for pain.  Apply moist heat compresses to the site of swollen lymph nodes as directed by your health care provider. This can help reduce any pain.  Check your lymph nodes daily for any changes.  Keep all follow-up visits as directed by your health care provider. This is important.  Contact a health care provider if:  Your lymph nodes are still swollen after 2 weeks.  Your swelling increases or spreads to other areas.  Your lymph nodes are hard, seem fixed to the skin, or are growing rapidly.  Your skin over the lymph nodes is red and inflamed.  You have a fever.  You have chills.  You have fatigue.  You develop a sore throat.  You have abdominal pain.  You have weight  loss.  You have night sweats. Get help right away if:  You notice fluid leaking from the area of the enlarged lymph node.  You have severe pain in any area of your body.  You have chest pain.  You have shortness of breath. This information is not intended to replace advice given to you by your health care provider. Make sure you discuss any questions you have with your health care provider. Document Released: 06/02/2008 Document Revised: 01/30/2016 Document Reviewed: 03/29/2014 Elsevier Interactive Patient Education  2018 [employer]  

## 2018-04-27 NOTE — Progress Notes (Signed)
52 y.o. [id number] Widowed African American Fe here for annual exam. Periods normal until no menses in 02/15/23 and June. Period in August normal. Noticed slight throat soreness a few weeks ago, but resolved. Spouse passed away in February 15, 2023 from heart attack, unexpected. Patient still struggling with emotions with his death. Family lives out of state, but has her children who are in high school, so comforting each other. She has friends who are supportive. Volunteers with [employer] also.Clarnce Flock PCP Dr.Barnes after spouse death for aex and labs, all normal per patient. Continues to see Dr. Chalmers Cater for hyperthyroid, but no medication needed at present. Recent mammogram normal. No other health concerns today. She and children and aunt took already planned cruise in June to [place] and islands.  LMP 04/07/18      Sexually active: No.  The current method of family planning is tubal ligation.    Exercising: No.  exercise Smoker:  no  Review of Systems  Constitutional: Negative.   HENT: Negative.   Eyes: Negative.   Respiratory: Negative.   Cardiovascular: Negative.   Gastrointestinal: Negative.   Genitourinary: Negative.   Musculoskeletal: Negative.   Skin: Negative.   Neurological: Negative.   Endo/Heme/Allergies: Negative.   Psychiatric/Behavioral: Negative.     Health Maintenance: Pap:  04-05-15 neg HPV HR Neg History of Abnormal Pap: no MMG:  01-24-18 category d density birads 1:neg Self Breast exams: no Colonoscopy:  2018 f/u 10yrs BMD:   none TDaP:  UTD  Shingles: no Pneumonia: no Hep C and HIV: HIV neg 2015 Labs:  If needed   reports that she has never smoked. She has never used smokeless tobacco. She reports that she does not drink alcohol or use drugs.  Past Medical History:  Diagnosis Date  . Allergic rhinitis, cause unspecified   . Chest pain, unspecified   . Dysmenorrhea   . Fibrocystic breast   . Hyperthyroidism    reported as borderline by Dr. Vashti Hey  . Impaired fasting  glucose   . Leukocytopenia, unspecified   . Palpitations   . Subacute thyroiditis   . Tinnitus   . Unspecified hereditary and idiopathic peripheral neuropathy     Past Surgical History:  Procedure Laterality Date  . BREAST BIOPSY Left 10/11/2009   for fibrocystic breast times 2  . BREAST EXCISIONAL BIOPSY Left 2003   benign  . BREAST EXCISIONAL BIOPSY Right 1993   benign  . CESAREAN SECTION     times 2  . TONSILLECTOMY    . TUBAL LIGATION      No current outpatient medications on file.   No current facility-administered medications for this visit.     Family History  Problem Relation Age of Onset  . Heart murmur Maternal Aunt   . Diabetes Maternal Grandmother   . Hypertension Maternal Grandmother   . Stroke Maternal Grandmother   . Migraines Maternal Grandmother   . Heart attack Paternal Grandfather   . Heart murmur Maternal Aunt   . Heart murmur Daughter   . Heart murmur Sister     ROS:  Pertinent items are noted in HPI.  Otherwise, a comprehensive ROS was negative.  Exam:   BP 120/68   Pulse 68   Resp 16   Ht 5' 5.25" (1.657 m)   Wt 143 lb (64.9 kg)   BMI 23.61 kg/m  Height: 5' 5.25" (165.7 cm) Ht Readings from Last 3 Encounters:  04/27/18 5' 5.25" (1.657 m)  04/12/17 5' 5.5" (1.664 m)  04/09/17  5' 5.5" (1.664 m)    General appearance: alert, cooperative and appears stated age Head: Normocephalic, without obvious abnormality, atraumatic, throat, no redness or exudate noted Neck:  supple, symmetrical, trachea midline and thyroid normal to inspection and palpation, Lymph nodes noted in enlarged,non tender submandibular,cervical, non tender Lungs: clear to auscultation bilaterally Breasts: normal appearance, no masses or tenderness, No nipple retraction or dimpling, No nipple discharge or bleeding, fibrocystic feel, axillary lymph nodes bilateral slightly enlarged, non tender Heart: regular rate and rhythm Abdomen: soft, non-tender; no masses,  no  organomegaly, spleen not palpated enlarged or tender Extremities: extremities normal, atraumatic, no cyanosis or edema Skin: Skin color, texture, turgor normal. No rashes or lesions Lymph nodes: Cervical, supraclavicular, and axillary nodes normal. No abnormal inguinal nodes palpated Neurologic: Grossly normal   Pelvic: External genitalia:  no lesions,normal female              Urethra:  normal appearing urethra with no masses, tenderness or lesions              Bartholin's and Skene's: normal                 Vagina: normal appearing vagina with normal color and discharge, no lesions              Cervix: multiparous appearance, no cervical motion tenderness and no lesions              Pap taken: Yes.   Bimanual Exam:  Uterus:  normal size, contour, position, consistency, mobility, non-tender              Adnexa: normal adnexa, no mass, fullness, tenderness and inquinal lymph nodes slightly enlarged bilateral, non tender               Rectovaginal: Confirms               Anus:  normal sphincter tone, no lesions  Chaperone present: yes  A:  Well Woman with normal exam  Contraception tubal ligation  Perimenopausal with cycle change  Social stress with sudden death of spouse  Enlarged lymph nodes ? etiology  P:   Reviewed health and wellness pertinent to exam  Discussed perimenopause/menopause etiology and period expectations. Discussed need to advise if no period in 3 months. Printed information given. Questions addressed.  Discussed grieving and need to have time to do this herself and children. Discussed Hospice resource and given pamphlet for information. Offered my condolences.  Discussed enlarged lymph nodes and possible etiology, bug bite, ?strep throat, viral illness. Hand out given. Patient wants to follow up with Dr. Drema Dallas regarding this. Plans to call for appointment today. Discussed CBC with diff here, so can be used to assess for infection. Patient agreeable.  Lab: CBC with  Diff  Pap smear: yes   counseled on breast self exam, mammography screening, menopause, adequate intake of calcium and vitamin D, diet and exercise, Kegel's exercises  return annually or prn  An After Visit Summary was printed and given to the patient.

## 2018-04-28 LAB — CBC WITH DIFFERENTIAL/PLATELET
BASOS: 1 %
Basophils Absolute: 0 10*3/uL (ref 0.0–0.2)
EOS (ABSOLUTE): 0.1 10*3/uL (ref 0.0–0.4)
EOS: 2 %
HEMOGLOBIN: 13.5 g/dL (ref 11.1–15.9)
Hematocrit: 41.8 % (ref 34.0–46.6)
IMMATURE GRANS (ABS): 0 10*3/uL (ref 0.0–0.1)
IMMATURE GRANULOCYTES: 0 %
LYMPHS: 41 %
Lymphocytes Absolute: 1.7 10*3/uL (ref 0.7–3.1)
MCH: 27.3 pg (ref 26.6–33.0)
MCHC: 32.3 g/dL (ref 31.5–35.7)
MCV: 85 fL (ref 79–97)
MONOCYTES: 8 %
Monocytes Absolute: 0.3 10*3/uL (ref 0.1–0.9)
NEUTROS ABS: 2 10*3/uL (ref 1.4–7.0)
NEUTROS PCT: 48 %
PLATELETS: 266 10*3/uL (ref 150–450)
RBC: 4.94 x10E6/uL (ref 3.77–5.28)
RDW: 14.7 % (ref 12.3–15.4)
WBC: 4.1 10*3/uL (ref 3.4–10.8)

## 2018-04-29 DIAGNOSIS — R590 Localized enlarged lymph nodes: Secondary | ICD-10-CM | POA: Diagnosis not present

## 2018-04-29 LAB — CYTOLOGY - PAP
Diagnosis: NEGATIVE
Diagnosis: REACTIVE
HPV: NOT DETECTED

## 2018-06-13 DIAGNOSIS — D72819 Decreased white blood cell count, unspecified: Secondary | ICD-10-CM | POA: Diagnosis not present

## 2018-06-13 DIAGNOSIS — E069 Thyroiditis, unspecified: Secondary | ICD-10-CM | POA: Diagnosis not present

## 2018-06-13 DIAGNOSIS — R7301 Impaired fasting glucose: Secondary | ICD-10-CM | POA: Diagnosis not present

## 2018-06-20 DIAGNOSIS — G609 Hereditary and idiopathic neuropathy, unspecified: Secondary | ICD-10-CM | POA: Diagnosis not present

## 2018-06-20 DIAGNOSIS — E069 Thyroiditis, unspecified: Secondary | ICD-10-CM | POA: Diagnosis not present

## 2018-06-20 DIAGNOSIS — R7301 Impaired fasting glucose: Secondary | ICD-10-CM | POA: Diagnosis not present

## 2018-11-29 DIAGNOSIS — J309 Allergic rhinitis, unspecified: Secondary | ICD-10-CM | POA: Diagnosis not present

## 2018-11-29 DIAGNOSIS — G43909 Migraine, unspecified, not intractable, without status migrainosus: Secondary | ICD-10-CM | POA: Diagnosis not present

## 2018-12-27 ENCOUNTER — Other Ambulatory Visit: Payer: Self-pay | Admitting: Family Medicine

## 2018-12-27 DIAGNOSIS — Z1231 Encounter for screening mammogram for malignant neoplasm of breast: Secondary | ICD-10-CM

## 2019-02-14 ENCOUNTER — Other Ambulatory Visit: Payer: Self-pay | Admitting: *Deleted

## 2019-02-14 DIAGNOSIS — Z20822 Contact with and (suspected) exposure to covid-19: Secondary | ICD-10-CM

## 2019-02-15 LAB — NOVEL CORONAVIRUS, NAA: SARS-CoV-2, NAA: NOT DETECTED

## 2019-02-22 ENCOUNTER — Ambulatory Visit
Admission: RE | Admit: 2019-02-22 | Discharge: 2019-02-22 | Disposition: A | Discharge status: Home / Self Care | Payer: 59 | Source: Ambulatory Visit | Attending: Family Medicine | Admitting: Family Medicine

## 2019-02-22 ENCOUNTER — Other Ambulatory Visit: Payer: Self-pay

## 2019-02-22 DIAGNOSIS — Z1231 Encounter for screening mammogram for malignant neoplasm of breast: Secondary | ICD-10-CM

## 2019-02-23 ENCOUNTER — Other Ambulatory Visit: Payer: Self-pay | Admitting: Family Medicine

## 2019-02-23 DIAGNOSIS — R6889 Other general symptoms and signs: Secondary | ICD-10-CM

## 2019-03-06 ENCOUNTER — Telehealth: Payer: Self-pay | Admitting: Hematology

## 2019-03-06 NOTE — Telephone Encounter (Signed)
Received a new hem referral from Dr. Drema Dallas for decreased wbc. Pt has been cld and scheduled to see Dr. Burr Medico on 6/30 at 130pm. Aware to arrive 15 minutes early.

## 2019-03-07 ENCOUNTER — Inpatient Hospital Stay: Payer: 59 | Attending: Hematology | Admitting: Hematology

## 2019-03-08 ENCOUNTER — Other Ambulatory Visit: Payer: Self-pay

## 2019-03-08 ENCOUNTER — Ambulatory Visit
Admission: RE | Admit: 2019-03-08 | Discharge: 2019-03-08 | Disposition: A | Discharge status: Home / Self Care | Payer: 59 | Source: Ambulatory Visit | Attending: Family Medicine | Admitting: Family Medicine

## 2019-03-08 DIAGNOSIS — R6889 Other general symptoms and signs: Secondary | ICD-10-CM

## 2019-05-02 ENCOUNTER — Ambulatory Visit: Payer: 59 | Admitting: Certified Nurse Midwife

## 2019-05-04 ENCOUNTER — Encounter: Payer: Self-pay | Admitting: Nurse Practitioner

## 2019-05-04 ENCOUNTER — Telehealth: Payer: Self-pay | Admitting: Nurse Practitioner

## 2019-05-04 ENCOUNTER — Inpatient Hospital Stay: Payer: 59

## 2019-05-04 ENCOUNTER — Inpatient Hospital Stay: Payer: 59 | Attending: Hematology | Admitting: Nurse Practitioner

## 2019-05-04 ENCOUNTER — Other Ambulatory Visit: Payer: Self-pay

## 2019-05-04 ENCOUNTER — Other Ambulatory Visit: Payer: Self-pay | Admitting: *Deleted

## 2019-05-04 VITALS — BP 111/77 | HR 79 | Temp 98.7°F | Resp 16 | Wt 148.7 lb

## 2019-05-04 DIAGNOSIS — M25569 Pain in unspecified knee: Secondary | ICD-10-CM | POA: Diagnosis not present

## 2019-05-04 DIAGNOSIS — G609 Hereditary and idiopathic neuropathy, unspecified: Secondary | ICD-10-CM | POA: Diagnosis not present

## 2019-05-04 DIAGNOSIS — D708 Other neutropenia: Secondary | ICD-10-CM | POA: Diagnosis not present

## 2019-05-04 LAB — CBC WITH DIFFERENTIAL (CANCER CENTER ONLY)
Abs Immature Granulocytes: 0 10*3/uL (ref 0.00–0.07)
Basophils Absolute: 0 10*3/uL (ref 0.0–0.1)
Basophils Relative: 1 %
Eosinophils Absolute: 0.1 10*3/uL (ref 0.0–0.5)
Eosinophils Relative: 2 %
HCT: 40.4 % (ref 36.0–46.0)
Hemoglobin: 12.9 g/dL (ref 12.0–15.0)
Immature Granulocytes: 0 %
Lymphocytes Relative: 41 %
Lymphs Abs: 1.5 10*3/uL (ref 0.7–4.0)
MCH: 26.5 pg (ref 26.0–34.0)
MCHC: 31.9 g/dL (ref 30.0–36.0)
MCV: 83 fL (ref 80.0–100.0)
Monocytes Absolute: 0.4 10*3/uL (ref 0.1–1.0)
Monocytes Relative: 10 %
Neutro Abs: 1.8 10*3/uL (ref 1.7–7.7)
Neutrophils Relative %: 46 %
Platelet Count: 248 10*3/uL (ref 150–400)
RBC: 4.87 MIL/uL (ref 3.87–5.11)
RDW: 14.3 % (ref 11.5–15.5)
WBC Count: 3.8 10*3/uL — ABNORMAL LOW (ref 4.0–10.5)
nRBC: 0 % (ref 0.0–0.2)

## 2019-05-04 LAB — SAVE SMEAR(SSMR), FOR PROVIDER SLIDE REVIEW

## 2019-05-04 LAB — VITAMIN B12: Vitamin B-12: 2081 pg/mL — ABNORMAL HIGH (ref 180–914)

## 2019-05-04 NOTE — Progress Notes (Addendum)
[place]  [phone] [phone]  Clinic New consult Note   Patient Care Team: Leighton Ruff, MD as PCP - General (Family Medicine) 05/04/2019  CHIEF COMPLAINTS/PURPOSE OF CONSULTATION:  Decreased WBC   HISTORY OF PRESENTING ILLNESS:  Suzanne Anderson 53 y.o. 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 She was found to have abnormal CBC from 02/20/2019 with WBC 3.3 and Neutrophils 1.4. No other cytopenias. On 02/07/2018 WBC was 3.2 with Neutrophils 1.4. On 04/27/2018 CBC was normal. Over the past 5 years her WBC has been intermittently low but mild. She denies history of recurrent infections such as respiratory, hepatitis, or HIV. She notes a history of B12 and vitamin D deficiency, but unsure if she's been on supplementation. She does not take regular medication. She admits to taking multivitamins inconsistently. She had a reaction to flexeril and vicodin that lead to "intestinal blockage." She denies abdominal surgery. She does have knee joint pain that seems worse lately, otherwise no small joint swelling or pain. Negative ANA in 2015.   PMH is significant for migraines, allergic rhinitis, peripheral neuropathy, hyperthyroid, and pre-DM. She denies h/o autoimmune disease. Socially, she lives at home with her 2 healthy children ages 59 and 57. Her husband passed away suddenly last year. Since then she has hard time remembering to take vitamins and care for herself. She has not slept a full night since his passing. She volunteers at soup kitchen. Denies alcohol, tobacco, or drug use. She denies family history of cancer or blood disorder. She reports being UTD on her age-appropriate cancer screenings.   Today she presents to clinic by herself. She currently has a migraine with photophobia and is wearing glasses. She denies change in her weight lately. Not sleeping well in general, so she is fatigued. Denies  change in bowel habits. Denies bleeding. She has occasional chest pressure and palpitations when she is stressed. She had a stress test years ago. She has pain and sensitivity in her knees. She is a runner, questions maybe she had bad shoes. Has burning in hands and feet for approx 2 years. She has pain in her left side when she does rigorous sit ups for exercise.   MEDICAL HISTORY:  Past Medical History:  Diagnosis Date  . Allergic rhinitis, cause unspecified   . Chest pain, unspecified   . Dysmenorrhea   . Fibrocystic breast   . Hyperthyroidism    reported as borderline by Dr. Vashti Hey  . Impaired fasting glucose   . Leukocytopenia, unspecified   . Palpitations   . Subacute thyroiditis   . Tinnitus   . Unspecified hereditary and idiopathic peripheral neuropathy     SURGICAL HISTORY: Past Surgical History:  Procedure Laterality Date  . BREAST BIOPSY Left 10/11/2009   for fibrocystic breast times 2  . BREAST EXCISIONAL BIOPSY Left 2003   benign  . BREAST EXCISIONAL BIOPSY Right 1993   benign  . CESAREAN SECTION     times 2  . TONSILLECTOMY    . TUBAL LIGATION      SOCIAL HISTORY: Social History   Socioeconomic History  . Marital status: Widowed    Spouse name: Not on file  . Number of children: 2  . Years of education: Associate  . Highest education level: Not on file  Occupational History  . Occupation: [employer] at [employer]  . Financial resource strain: Not on file  . Food insecurity  Worry: Not on file    Inability: Not on file  . Transportation needs    Medical: Not on file    Non-medical: Not on file  Tobacco Use  . Smoking status: Never Smoker  . Smokeless tobacco: Never Used  Substance and Sexual Activity  . Alcohol use: No  . Drug use: No  . Sexual activity: Not Currently    Partners: Male    Birth control/protection: Surgical    Comment: tubal ligation  Lifestyle  . Physical activity    Days per week: Not on file    Minutes per  session: Not on file  . Stress: Not on file  Relationships  . Social Herbalist on phone: Not on file    Gets together: Not on file    Attends religious service: Not on file    Active member of club or organization: Not on file    Attends meetings of clubs or organizations: Not on file    Relationship status: Not on file  . Intimate partner violence    Fear of current or ex partner: Not on file    Emotionally abused: Not on file    Physically abused: Not on file    Forced sexual activity: Not on file  Other Topics Concern  . Not on file  Social History Narrative   Patient is married with 2 children.   Patient is right handed.   Patient has an Associate's degree.   Patient does not drink any caffeine.    FAMILY HISTORY: Family History  Problem Relation Age of Onset  . Heart murmur Maternal Aunt   . Diabetes Maternal Grandmother   . Hypertension Maternal Grandmother   . Stroke Maternal Grandmother   . Migraines Maternal Grandmother   . Heart attack Paternal Grandfather   . Heart murmur Maternal Aunt   . Heart murmur Daughter   . Heart murmur Sister     ALLERGIES:  is allergic to flexeril [cyclobenzaprine]; peanuts [peanut oil]; terazol [terconazole]; vicodin [hydrocodone-acetaminophen]; and monistat [miconazole].  MEDICATIONS:  No current outpatient medications on file.   No current facility-administered medications for this visit.    PHYSICAL EXAMINATION: ECOG PERFORMANCE STATUS: 0 - Asymptomatic  Vitals:   05/04/19 1346  BP: 111/77  Pulse: 79  Resp: 16  Temp: 98.7 F (37.1 C)  SpO2: 97%   Filed Weights   05/04/19 1346  Weight: 148 lb 11.2 oz (67.4 kg)    GENERAL:alert, no distress and comfortable SKIN: no rash  EYES: sclera clear NECK: without mass  LYMPH:  no palpable cervical or supraclavicular lymphadenopathy LUNGS: clear to auscultation with normal breathing effort HEART: regular rate & rhythm, no lower extremity edema ABDOMEN:abdomen  soft, non-tender and normal bowel sounds. No hepatosplenomegaly Musculoskeletal:no cyanosis of digits  PSYCH: alert & oriented x 3 with fluent speech NEURO: normal gait   LABORATORY DATA:  I have reviewed the data as listed CBC Latest Ref Rng & Units 05/04/2019 04/27/2018 04/05/2015  WBC 4.0 - 10.5 K/uL 3.8(L) 4.1 -  Hemoglobin 12.0 - 15.0 g/dL 12.9 13.5 12.5  Hematocrit 36.0 - 46.0 % 40.4 41.8 -  Platelets 150 - 400 K/uL 248 266 -     RADIOGRAPHIC STUDIES: I have personally reviewed the radiological images as listed and agreed with the findings in the report. No results found.  ASSESSMENT & PLAN: 53 yo AAF with h/o migraines, hyperthyroidism, peripheral neuropathy, pre-DM  1. Leukopenia with predominant neutropenia  -we reviewed her medical record  in detail with the patient. Over the last 5 years she has a very mild and intermittent neutropenia, no other cytopenias.  -We reviewed common etiologies of neutropenia such as infection, autoimmune, ethnic, cyclical, nutritional, or primary bone marrow disease -She denies recurrent infection or autoimmune disease. She had negative ANA in 2015. Low suspicion for primary bone marrow disease due to no other cytopenias, young age, and no other symptoms -will repeat CBC and check B12, Folate, and MMA to r/o nutritional deficiencies. She does not a h/o B12 deficiency. Also has neuropathy which can be a symptom of low B12. 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  -We reviewed she is at higher risk for infection due to low WBC count.  -will f/u on outstanding labs today. We plan to monitor CBC q6 months and f/u in 1 year. If she remains stable she can likely f/u with PCP in the future.    PLAN: -Labs today, in 6 and 12 months  -F/u in 1 year  All questions were answered. The patient knows to call the clinic with any problems,  questions or concerns.     Alla Feeling, NP 05/04/19 4:35 PM   Addendum  Ms Weikel was referred for mild leukopenia, mainly neutropenia.  Per patient, it has been intermittent and chronic for many years.  Her outside lab results and her CBC in the epic system confirms that.  She has no history of frequent infection, or clinical concern for rheumatological disease. We discussed the possible etiology for her mild neutropenia, such as X02 or folic acid deficiency, normal variation due to ethnicity (African-American), autoimmune neutropenia, primary bone marrow disease, such as MDS or lymphoma.  I have very low suspicion for primary bone marrow disease, giving her intermittent mild neutropenia, no other cytopenias,, asymptomatic, and normal physical exam.  I do not think she needs a bone marrow biopsy at this point.  We will plan to repeat labs today, and monitor her CBC with annual follow-up.  All questions were answered.  Truitt Merle  05/04/2019

## 2019-05-04 NOTE — Telephone Encounter (Signed)
Scheduled patient per 08/27 los, patient received avs and calender.

## 2019-05-05 LAB — FOLATE RBC
Folate, Hemolysate: 403 ng/mL
Folate, RBC: 1013 ng/mL (ref >498)
Hematocrit: 39.8 % (ref 34.0–46.6)

## 2019-05-06 LAB — METHYLMALONIC ACID, SERUM: Methylmalonic Acid, Quantitative: 97 nmol/L (ref 0–378)

## 2019-05-08 ENCOUNTER — Telehealth: Payer: Self-pay | Admitting: *Deleted

## 2019-05-08 NOTE — Telephone Encounter (Signed)
Per Regan Rakers NP, Called pt in regard to lab results. Informed pt per Lacie NP, that her B12 is elevated and there are no 123456 or folic acid deficiencies. WBC is close to normal and Labs will be repeated in 6 months. Pt verbalized understanding.

## 2019-05-08 NOTE — Telephone Encounter (Signed)
-----   Message from Alla Feeling, NP sent at 05/08/2019  1:12 PM EDT ----- Please let her know lab results. She does not have 123456 or folic acid deficiency. B12 is actually elevated. WBC is 3.8, very close to normal, this very well could be normal variation for her ethnicity. We'll continue monitoring. Labs in 6 months. Call us if she has any other concerns/needs in the meantime. Thanks, Regan Rakers

## 2019-06-22 ENCOUNTER — Other Ambulatory Visit: Payer: Self-pay

## 2019-06-26 ENCOUNTER — Other Ambulatory Visit: Payer: Self-pay

## 2019-06-26 ENCOUNTER — Encounter: Payer: Self-pay | Admitting: Certified Nurse Midwife

## 2019-06-26 ENCOUNTER — Ambulatory Visit (INDEPENDENT_AMBULATORY_CARE_PROVIDER_SITE_OTHER): Payer: 59 | Admitting: Certified Nurse Midwife

## 2019-06-26 ENCOUNTER — Telehealth: Payer: Self-pay | Admitting: *Deleted

## 2019-06-26 VITALS — BP 100/64 | HR 68 | Temp 97.1°F | Resp 16 | Ht 65.25 in | Wt 147.0 lb

## 2019-06-26 DIAGNOSIS — Z01419 Encounter for gynecological examination (general) (routine) without abnormal findings: Secondary | ICD-10-CM

## 2019-06-26 DIAGNOSIS — Z01411 Encounter for gynecological examination (general) (routine) with abnormal findings: Secondary | ICD-10-CM

## 2019-06-26 DIAGNOSIS — N6310 Unspecified lump in the right breast, unspecified quadrant: Secondary | ICD-10-CM | POA: Diagnosis not present

## 2019-06-26 DIAGNOSIS — Z659 Problem related to unspecified psychosocial circumstances: Secondary | ICD-10-CM | POA: Diagnosis not present

## 2019-06-26 NOTE — Telephone Encounter (Signed)
Spoke with Joaquim Lai at [facility]. Right breast Dx MMG and [place], if needed, at [facility] on 10/22 at 1:10pm, arrive at 12:50pm.    Spoke with patient, advised per Melvia Heaps, CNM. Patient verbalizes understanding and is agreeable.   Routing to provider for final review. Patient is agreeable to disposition. Will close encounter.

## 2019-06-26 NOTE — Patient Instructions (Signed)

## 2019-06-26 NOTE — Telephone Encounter (Signed)
-----   Message from Regina Eck, CNM sent at 06/26/2019  2:41 PM EDT ----- Patient needs diagnostic mammogram and [place] of right breast at 9 o'clock see note

## 2019-06-26 NOTE — Progress Notes (Signed)
53 y.o. [id number] Widowed  African American Fe here for annual exam. Periods normal monthly, no change. Denies any missed periods in last year. Still dealing with death of spouse who died last year. Daughter 18,19 busy and supportive at times. Patient still volunteering periodically. Sees Dr. Drema Dallas for every 6 months for follow up of low WBC count. No diagnosis of problem, possible normal for her. Patient has noted breast lump in right breast that comes and goes with period, feels it is less tender today, as period ends. Please check. No other health issues today.  Patient's last menstrual period was 06/22/2019 (exact date).          Sexually active: Yes.    The current method of family planning is tubal ligation.    Exercising: Yes.    running Smoker:  no  Review of Systems  Constitutional: Negative.   HENT: Negative.   Eyes: Negative.   Respiratory: Negative.   Cardiovascular: Negative.   Gastrointestinal: Negative.   Genitourinary: Negative.   Musculoskeletal: Negative.   Skin: Negative.   Neurological: Negative.   Endo/Heme/Allergies: Negative.   Psychiatric/Behavioral: Negative.     Health Maintenance: Pap:  04-05-15 neg HPV HR neg, 04-27-18 neg HPV HR neg History of Abnormal Pap: no MMG:  02-22-2019 category d density birads 1:neg Self Breast exams: occ Colonoscopy:  2018 f/u 33yrs BMD:   none TDaP:  2020 Shingles: no Pneumonia: no Hep C and HIV: HIV neg 2015 Labs: PCP   reports that she has never smoked. She has never used smokeless tobacco. She reports that she does not drink alcohol or use drugs.  Past Medical History:  Diagnosis Date  . Allergic rhinitis, cause unspecified   . Chest pain, unspecified   . Dysmenorrhea   . Fibrocystic breast   . Hyperthyroidism    reported as borderline by Dr. Vashti Hey  . Impaired fasting glucose   . Leukocytopenia, unspecified   . Palpitations   . Subacute thyroiditis   . Tinnitus   . Unspecified hereditary and idiopathic  peripheral neuropathy     Past Surgical History:  Procedure Laterality Date  . BREAST BIOPSY Left 10/11/2009   for fibrocystic breast times 2  . BREAST EXCISIONAL BIOPSY Left 2003   benign  . BREAST EXCISIONAL BIOPSY Right 1993   benign  . CESAREAN SECTION     times 2  . TONSILLECTOMY    . TUBAL LIGATION      No current outpatient medications on file.   No current facility-administered medications for this visit.     Family History  Problem Relation Age of Onset  . Heart murmur Maternal Aunt   . Diabetes Maternal Grandmother   . Hypertension Maternal Grandmother   . Stroke Maternal Grandmother   . Migraines Maternal Grandmother   . Heart attack Paternal Grandfather   . Heart murmur Maternal Aunt   . Heart murmur Daughter   . Heart murmur Sister     ROS:  Pertinent items are noted in HPI.  Otherwise, a comprehensive ROS was negative.  Exam:   BP 100/64   Pulse 68   Temp (!) 97.1 F (36.2 C) (Skin)   Resp 16   Ht 5' 5.25" (1.657 m)   Wt 147 lb (66.7 kg)   LMP 06/22/2019 (Exact Date)   BMI 24.27 kg/m  Height: 5' 5.25" (165.7 cm) Ht Readings from Last 3 Encounters:  06/26/19 5' 5.25" (1.657 m)  04/27/18 5' 5.25" (1.657 m)  04/12/17 5' 5.5" (1.664  m)    General appearance: alert, cooperative and appears stated age Head: Normocephalic, without obvious abnormality, atraumatic Neck: no adenopathy, supple, symmetrical, trachea midline and thyroid normal to inspection and palpation and slight enlargement bilateral Lungs: clear to auscultation bilaterally Breasts: normal appearance, no masses or tenderness, No nipple retraction or dimpling, No nipple discharge or bleeding, No axillary or supraclavicular adenopathy, right breast at 9 o'clock outer edge of breast, non tender, mobile Heart: regular rate and rhythm Abdomen: soft, non-tender; no masses,  no organomegaly Extremities: extremities normal, atraumatic, no cyanosis or edema Skin: Skin color, texture, turgor  normal. No rashes or lesions Lymph nodes: Cervical, supraclavicular, and axillary nodes normal. No abnormal inguinal nodes palpated Neurologic: Grossly normal   Pelvic: External genitalia:  no lesions              Urethra:  normal appearing urethra with no masses, tenderness or lesions              Bartholin's and Skene's: normal                 Vagina: normal appearing vagina with normal color and discharge, no lesions              Cervix: multiparous appearance, no cervical motion tenderness and no lesions              Pap taken: No. Bimanual Exam:  Uterus:  normal size, contour, position, consistency, mobility, non-tender and mid position              Adnexa: normal adnexa and no mass, fullness, tenderness               Rectovaginal: Confirms               Anus:  normal sphincter tone, no lesions  Chaperone present: yes  A:  Well Woman with normal exam  Contraception none BTL  Right breast  Mass  Social stress with loss of spouse last year  Low WBC with PCP management  P:   Reviewed health and wellness pertinent to exam  Discussed right breast mass finding and feel needs to have evaluation with diagnostic mammogram and [place]. Patient agreeable. Patient will be called with appointment.  Discussed seeking family and friend support.  Continue PCP follow up as indicated.  Pap smear: no   counseled on breast self exam, mammography screening, menopause, adequate intake of calcium and vitamin D, diet and exercise  return annually or prn  An After Visit Summary was printed and given to the patient.

## 2019-06-29 ENCOUNTER — Other Ambulatory Visit: Payer: 59

## 2019-07-06 ENCOUNTER — Other Ambulatory Visit: Payer: Self-pay

## 2019-07-06 ENCOUNTER — Ambulatory Visit
Admission: RE | Admit: 2019-07-06 | Discharge: 2019-07-06 | Disposition: A | Discharge status: Home / Self Care | Payer: 59 | Source: Ambulatory Visit | Attending: Certified Nurse Midwife | Admitting: Certified Nurse Midwife

## 2019-07-06 DIAGNOSIS — N6310 Unspecified lump in the right breast, unspecified quadrant: Secondary | ICD-10-CM

## 2019-11-01 ENCOUNTER — Other Ambulatory Visit: Payer: Self-pay

## 2019-11-01 DIAGNOSIS — D708 Other neutropenia: Secondary | ICD-10-CM

## 2019-11-02 ENCOUNTER — Other Ambulatory Visit: Payer: Self-pay

## 2019-11-02 ENCOUNTER — Inpatient Hospital Stay: Payer: 59 | Attending: Nurse Practitioner

## 2019-11-02 DIAGNOSIS — D708 Other neutropenia: Secondary | ICD-10-CM | POA: Diagnosis present

## 2019-11-02 LAB — CBC WITH DIFFERENTIAL (CANCER CENTER ONLY)
Abs Immature Granulocytes: 0.01 10*3/uL (ref 0.00–0.07)
Basophils Absolute: 0 10*3/uL (ref 0.0–0.1)
Basophils Relative: 1 %
Eosinophils Absolute: 0.1 10*3/uL (ref 0.0–0.5)
Eosinophils Relative: 2 %
HCT: 41.3 % (ref 36.0–46.0)
Hemoglobin: 13.5 g/dL (ref 12.0–15.0)
Immature Granulocytes: 0 %
Lymphocytes Relative: 39 %
Lymphs Abs: 1.2 10*3/uL (ref 0.7–4.0)
MCH: 26.8 pg (ref 26.0–34.0)
MCHC: 32.7 g/dL (ref 30.0–36.0)
MCV: 81.9 fL (ref 80.0–100.0)
Monocytes Absolute: 0.3 10*3/uL (ref 0.1–1.0)
Monocytes Relative: 10 %
Neutro Abs: 1.5 10*3/uL — ABNORMAL LOW (ref 1.7–7.7)
Neutrophils Relative %: 48 %
Platelet Count: 260 10*3/uL (ref 150–400)
RBC: 5.04 MIL/uL (ref 3.87–5.11)
RDW: 14 % (ref 11.5–15.5)
WBC Count: 3.1 10*3/uL — ABNORMAL LOW (ref 4.0–10.5)
nRBC: 0 % (ref 0.0–0.2)

## 2019-11-02 LAB — VITAMIN B12: Vitamin B-12: 1424 pg/mL — ABNORMAL HIGH (ref 180–914)

## 2019-11-27 ENCOUNTER — Encounter: Payer: Self-pay | Admitting: Certified Nurse Midwife

## 2020-05-01 ENCOUNTER — Other Ambulatory Visit: Payer: Self-pay | Admitting: Nurse Practitioner

## 2020-05-01 DIAGNOSIS — D708 Other neutropenia: Secondary | ICD-10-CM

## 2020-05-01 NOTE — Progress Notes (Deleted)
[place]   [phone] [phone]   Clinic Follow up Note   Patient Care Team: Leighton Ruff, MD as PCP - General (Family Medicine) 05/01/2020  CHIEF COMPLAINT: Follow-up neutropenia  CURRENT THERAPY: Observation  INTERVAL HISTORY: Ms. Heidemann returns for annual follow-up as scheduled.  She was last seen by me on 05/04/2019.  Lab on 11/02/2019 showed WBC 3.1 ANC 1.5, no other cytopenias.   REVIEW OF SYSTEMS:   Constitutional: Denies fevers, chills or abnormal weight loss Eyes: Denies blurriness of vision Ears, nose, mouth, throat, and face: Denies mucositis or sore throat Respiratory: Denies cough, dyspnea or wheezes Cardiovascular: Denies palpitation, chest discomfort or lower extremity swelling Gastrointestinal:  Denies nausea, heartburn or change in bowel habits Skin: Denies abnormal skin rashes Lymphatics: Denies new lymphadenopathy or easy bruising Neurological:Denies numbness, tingling or new weaknesses Behavioral/Psych: Mood is stable, no new changes  All other systems were reviewed with the patient and are negative.  MEDICAL HISTORY:  Past Medical History:  Diagnosis Date   Allergic rhinitis, cause unspecified    Chest pain, unspecified    Dysmenorrhea    Fibrocystic breast    Hyperthyroidism    reported as borderline by Dr. Vashti Hey   Impaired fasting glucose    Leukocytopenia, unspecified    Palpitations    Subacute thyroiditis    Tinnitus    Unspecified hereditary and idiopathic peripheral neuropathy     SURGICAL HISTORY: Past Surgical History:  Procedure Laterality Date   BREAST BIOPSY Left 10/11/2009   for fibrocystic breast times 2   BREAST EXCISIONAL BIOPSY Left 2003   benign   BREAST EXCISIONAL BIOPSY Right 1993   benign   CESAREAN SECTION     times 2   TONSILLECTOMY     TUBAL LIGATION      I have reviewed the social history and family history with the patient and they are  unchanged from previous note.  ALLERGIES:  is allergic to flexeril [cyclobenzaprine], peanuts [peanut oil], terazol [terconazole], vicodin [hydrocodone-acetaminophen], and monistat [miconazole].  MEDICATIONS:  No current outpatient medications on file.   No current facility-administered medications for this visit.    PHYSICAL EXAMINATION: ECOG PERFORMANCE STATUS: {CHL ONC ECOG PS:[id number]}  There were no vitals filed for this visit. There were no vitals filed for this visit.  GENERAL:alert, no distress and comfortable SKIN: skin color, texture, turgor are normal, no rashes or significant lesions EYES: normal, Conjunctiva are pink and non-injected, sclera clear OROPHARYNX:no exudate, no erythema and lips, buccal mucosa, and tongue normal  NECK: supple, thyroid normal size, non-tender, without nodularity LYMPH:  no palpable lymphadenopathy in the cervical, axillary or inguinal LUNGS: clear to auscultation and percussion with normal breathing effort HEART: regular rate & rhythm and no murmurs and no lower extremity edema ABDOMEN:abdomen soft, non-tender and normal bowel sounds Musculoskeletal:no cyanosis of digits and no clubbing  NEURO: alert & oriented x 3 with fluent speech, no focal motor/sensory deficits  LABORATORY DATA:  I have reviewed the data as listed CBC Latest Ref Rng & Units 11/02/2019 05/04/2019 05/04/2019  WBC 4.0 - 10.5 K/uL 3.1(L) 3.8(L) -  Hemoglobin 12.0 - 15.0 g/dL 13.5 12.9 -  Hematocrit 36 - 46 % 41.3 39.8 40.4  Platelets 150 - 400 K/uL 260 248 -     CMP Latest Ref Rng & Units 05/03/2014 11/17/2013 03/13/2008  Glucose 70 - 99 mg/dL - 112(H) 93  BUN 6 - 23 mg/dL - 14 8  Creatinine 0.50 - 1.10 mg/dL -  0.88 0.70  Sodium 137 - 147 mEq/L - 136(L) 138  Potassium 3.7 - 5.3 mEq/L - 4.5 3.7  Chloride 96 - 112 mEq/L - 99 102  CO2 19 - 32 mEq/L - 23 28  Calcium 8.4 - 10.5 mg/dL - 9.8 9.6  Total Protein 6.0 - 8.5 g/dL 7.4 - 7.3  Total Bilirubin - - - 0.8  Alkaline  Phos - - - 39  AST - - - 25  ALT - - - 18      RADIOGRAPHIC STUDIES: I have personally reviewed the radiological images as listed and agreed with the findings in the report. No results found.   ASSESSMENT & PLAN:  No problem-specific Assessment & Plan notes found for this encounter.   No orders of the defined types were placed in this encounter.  All questions were answered. The patient knows to call the clinic with any problems, questions or concerns. No barriers to learning was detected. I spent {CHL ONC TIME VISIT - YELYH:[id number]} counseling the patient face to face. The total time spent in the appointment was {CHL ONC TIME VISIT - OECXF:[id number]} and more than 50% was on counseling and review of test results     Alla Feeling, NP 05/01/20

## 2020-05-02 ENCOUNTER — Inpatient Hospital Stay: Payer: 59

## 2020-05-02 ENCOUNTER — Inpatient Hospital Stay: Payer: 59 | Attending: Nurse Practitioner | Admitting: Nurse Practitioner

## 2020-06-27 NOTE — Progress Notes (Signed)
54 y.o. [id number] Widowed Black or African American Not Hispanic or Latino female here for annual exam.  She is having some intermittent hot flashes day and night. No major night sweats. No vaginal dryness. Her husband died suddenly in Dec 18, 2017. They had been married x 19 years.  Period Cycle (Days): 28 Period Duration (Days): 3 Period Pattern: Regular Menstrual Flow: Light Menstrual Control: Thin pad (occasional tampon) Menstrual Control Change Freq (Hours):  (depends on the day) Dysmenorrhea: (!) Moderate Dysmenorrhea Symptoms: Cramping, Headache  Patient's last menstrual period was 06/18/2020 (within days).          Sexually active: No.  The current method of family planning is tubal ligation.    Exercising: Yes.    daily Smoker:  no  Health Maintenance: Pap:  04-27-18 neg HPV HR neg  04-05-15 neg HPV HR neg History of abnormal Pap:  no MMG:  07/06/19 Diagnostic MM/US Right Breast BIRADS 2 benign/density d BMD:   never Colonoscopy: 12-18-16 f/u 10 years TDaP:  01/06/2019 Gardasil: n/a Covid vaccine: per patient completed in Summer 2021   reports that she has never smoked. She has never used smokeless tobacco. She reports that she does not drink alcohol and does not use drugs. Daughters are almost 46 and 96. They are both in college, the oldest is studying music Estate manager/land agent) and the younger one is studying psychology. She works at [employer].   Past Medical History:  Diagnosis Date  . Allergic rhinitis, cause unspecified   . Chest pain, unspecified   . Dysmenorrhea   . Fibrocystic breast   . Hyperthyroidism    reported as borderline by Dr. Vashti Hey  . Impaired fasting glucose   . Leukocytopenia, unspecified   . Palpitations   . Subacute thyroiditis   . Tinnitus   . Unspecified hereditary and idiopathic peripheral neuropathy     Past Surgical History:  Procedure Laterality Date  . BREAST BIOPSY Left 10/11/2009   for fibrocystic breast times 2  . BREAST  EXCISIONAL BIOPSY Left 2001-12-18   benign  . BREAST EXCISIONAL BIOPSY Right 1993   benign  . CESAREAN SECTION     times 2  . TONSILLECTOMY    . TUBAL LIGATION      Current Outpatient Medications  Medication Sig Dispense Refill  . albuterol (VENTOLIN HFA) 108 (90 Base) MCG/ACT inhaler Inhale 2 puffs into the lungs as needed.    . Ascorbic Acid (VITAMIN C) 100 MG tablet Take 100 mg by mouth daily.    . cyanocobalamin 100 MCG tablet Take 100 mcg by mouth daily.    Marland Kitchen GLUCOSAMINE-CHONDROITIN PO Take by mouth.    . montelukast (SINGULAIR) 10 MG tablet Take 10 mg by mouth as needed.    . Multiple Vitamin (CALCIUM COMPLEX PO) Take by mouth.    . Multiple Vitamins-Minerals (CENTRUM ADULTS PO) Take by mouth.    Marland Kitchen VITAMIN D PO Take by mouth.     No current facility-administered medications for this visit.    Family History  Problem Relation Age of Onset  . Heart murmur Maternal Aunt   . Diabetes Maternal Grandmother   . Hypertension Maternal Grandmother   . Stroke Maternal Grandmother   . Migraines Maternal Grandmother   . Heart attack Paternal Grandfather   . Heart murmur Maternal Aunt   . Heart murmur Daughter   . Heart murmur Sister     Review of Systems  Constitutional: Negative.   HENT: Negative.   Eyes: Negative.  Respiratory: Negative.   Cardiovascular: Negative.   Gastrointestinal: Negative.   Endocrine: Negative.   Genitourinary: Negative.   Musculoskeletal: Negative.   Skin: Negative.   Allergic/Immunologic: Negative.   Neurological: Negative.   Hematological: Negative.   Psychiatric/Behavioral: Negative.     Exam:   BP 112/70 (BP Location: Right Arm, Patient Position: Sitting, Cuff Size: Normal)   Pulse 67   Ht 5' 5.25" (1.657 m)   Wt 146 lb (66.2 kg)   LMP 06/18/2020 (Within Days)   SpO2 100%   BMI 24.11 kg/m   Weight change: @WEIGHTCHANGE @ Height:   Height: 5' 5.25" (165.7 cm)  Ht Readings from Last 3 Encounters:  06/28/20 5' 5.25" (1.657 m)  06/26/19 5'  5.25" (1.657 m)  04/27/18 5' 5.25" (1.657 m)    General appearance: alert, cooperative and appears stated age Head: Normocephalic, without obvious abnormality, atraumatic Neck: no adenopathy, supple, symmetrical, trachea midline and thyroid normal to inspection and palpation Lungs: clear to auscultation bilaterally Cardiovascular: regular rate and rhythm Breasts: multiple smooth mobile lumps in both breasts, too numerous to count. The patient has been followed for this for years, multiple biopsies.  Abdomen: soft, non-tender; non distended,  no masses,  no organomegaly Extremities: extremities normal, atraumatic, no cyanosis or edema Skin: Skin color, texture, turgor normal. No rashes or lesions Lymph nodes: Cervical, supraclavicular, and axillary nodes normal. No abnormal inguinal nodes palpated Neurologic: Grossly normal   Pelvic: External genitalia:  no lesions              Urethra:  normal appearing urethra with no masses, tenderness or lesions              Bartholins and Skenes: normal                 Vagina: normal appearing vagina with normal color and discharge, no lesions              Cervix: no lesions               Bimanual Exam:  Uterus:  normal size, contour, position, consistency, mobility, non-tender              Adnexa: no mass, fullness, tenderness               Rectovaginal: Confirms               Anus:  normal sphincter tone, no lesions  Shanon Petty chaperoned for the exam.  A:  Well Woman with normal exam  Multiple smooth mobile lumps in both breasts, has been followed for years, multiple prior biopsies.   P:   No pap this year  Mammogram due, she will schedule  Coloscopy UTD  Labs with primary  Discussed breast self exam  Discussed calcium and vit D intake

## 2020-06-28 ENCOUNTER — Other Ambulatory Visit: Payer: Self-pay

## 2020-06-28 ENCOUNTER — Other Ambulatory Visit: Payer: Self-pay | Admitting: Family Medicine

## 2020-06-28 ENCOUNTER — Encounter: Payer: Self-pay | Admitting: Obstetrics and Gynecology

## 2020-06-28 ENCOUNTER — Ambulatory Visit (INDEPENDENT_AMBULATORY_CARE_PROVIDER_SITE_OTHER): Payer: 59 | Admitting: Obstetrics and Gynecology

## 2020-06-28 VITALS — BP 112/70 | HR 67 | Ht 65.25 in | Wt 146.0 lb

## 2020-06-28 DIAGNOSIS — Z01419 Encounter for gynecological examination (general) (routine) without abnormal findings: Secondary | ICD-10-CM | POA: Diagnosis not present

## 2020-06-28 DIAGNOSIS — N63 Unspecified lump in unspecified breast: Secondary | ICD-10-CM

## 2020-06-28 DIAGNOSIS — Z1231 Encounter for screening mammogram for malignant neoplasm of breast: Secondary | ICD-10-CM

## 2020-06-28 NOTE — Patient Instructions (Addendum)
EXERCISE AND DIET:  We recommended that you start or continue a regular exercise program for good health. Regular exercise means any activity that makes your heart beat faster and makes you sweat.  We recommend exercising at least 30 minutes per day at least 3 days a week, preferably 4 or 5.  We also recommend a diet low in fat and sugar.  Inactivity, poor dietary choices and obesity can cause diabetes, heart attack, stroke, and kidney damage, among others.    ALCOHOL AND SMOKING:  Women should limit their alcohol intake to no more than 7 drinks/beers/glasses of wine (combined, not each!) per week. Moderation of alcohol intake to this level decreases your risk of breast cancer and liver damage. And of course, no recreational drugs are part of a healthy lifestyle.  And absolutely no smoking or even second hand smoke. Most people know smoking can cause heart and lung diseases, but did you know it also contributes to weakening of your bones? Aging of your skin?  Yellowing of your teeth and nails?  CALCIUM AND VITAMIN D:  Adequate intake of calcium and Vitamin D are recommended.  The recommendations for exact amounts of these supplements seem to change often, but generally speaking 1,000 mg of calcium (between diet and supplement) and 800 units of Vitamin D per day seems prudent. Certain women may benefit from higher intake of Vitamin D.  If you are among these women, your doctor will have told you during your visit.    PAP SMEARS:  Pap smears, to check for cervical cancer or precancers,  have traditionally been done yearly, although recent scientific advances have shown that most women can have pap smears less often.  However, every woman still should have a physical exam from her gynecologist every year. It will include a breast check, inspection of the vulva and vagina to check for abnormal growths or skin changes, a visual exam of the cervix, and then an exam to evaluate the size and shape of the uterus and  ovaries.  And after 53 years of age, a rectal exam is indicated to check for rectal cancers. We will also provide age appropriate advice regarding health maintenance, like when you should have certain vaccines, screening for sexually transmitted diseases, bone density testing, colonoscopy, mammograms, etc.   MAMMOGRAMS:  All women over 53 years old should have a yearly mammogram. Many facilities now offer a "3D" mammogram, which may cost around $50 extra out of pocket. If possible,  we recommend you accept the option to have the 3D mammogram performed.  It both reduces the number of women who will be called back for extra views which then turn out to be normal, and it is better than the routine mammogram at detecting truly abnormal areas.    COLON CANCER SCREENING: Now recommend starting at age 53. At this time colonoscopy is not covered for routine screening until 50. There are take home tests that can be done between 53-57.   COLONOSCOPY:  Colonoscopy to screen for colon cancer is recommended for all women at age 53.  We know, you hate the idea of the prep.  We agree, BUT, having colon cancer and not knowing it is worse!!  Colon cancer so often starts as a polyp that can be seen and removed at colonscopy, which can quite literally save your life!  And if your first colonoscopy is normal and you have no family history of colon cancer, most women don't have to have it again for   10 years.  Once every ten years, you can do something that may end up saving your life, right?  We will be happy to help you get it scheduled when you are ready.  Be sure to check your insurance coverage so you understand how much it will cost.  It may be covered as a preventative service at no cost, but you should check your particular policy.      Breast Self-Awareness Breast self-awareness means being familiar with how your breasts look and feel. It involves checking your breasts regularly and reporting any changes to your  health care provider. Practicing breast self-awareness is important. A change in your breasts can be a sign of a serious medical problem. Being familiar with how your breasts look and feel allows you to find any problems early, when treatment is more likely to be successful. All women should practice breast self-awareness, including women who have had breast implants. How to do a breast self-exam One way to learn what is normal for your breasts and whether your breasts are changing is to do a breast self-exam. To do a breast self-exam: Look for Changes  1. Remove all the clothing above your waist. 2. Stand in front of a mirror in a room with good lighting. 3. Put your hands on your hips. 4. Push your hands firmly downward. 5. Compare your breasts in the mirror. Look for differences between them (asymmetry), such as: ? Differences in shape. ? Differences in size. ? Puckers, dips, and bumps in one breast and not the other. 6. Look at each breast for changes in your skin, such as: ? Redness. ? Scaly areas. 7. Look for changes in your nipples, such as: ? Discharge. ? Bleeding. ? Dimpling. ? Redness. ? A change in position. Feel for Changes Carefully feel your breasts for lumps and changes. It is best to do this while lying on your back on the floor and again while sitting or standing in the shower or tub with soapy water on your skin. Feel each breast in the following way:  Place the arm on the side of the breast you are examining above your head.  Feel your breast with the other hand.  Start in the nipple area and make  inch (2 cm) overlapping circles to feel your breast. Use the pads of your three middle fingers to do this. Apply light pressure, then medium pressure, then firm pressure. The light pressure will allow you to feel the tissue closest to the skin. The medium pressure will allow you to feel the tissue that is a little deeper. The firm pressure will allow you to feel the tissue  close to the ribs.  Continue the overlapping circles, moving downward over the breast until you feel your ribs below your breast.  Move one finger-width toward the center of the body. Continue to use the  inch (2 cm) overlapping circles to feel your breast as you move slowly up toward your collarbone.  Continue the up and down exam using all three pressures until you reach your armpit.  Write Down What You Find  Write down what is normal for each breast and any changes that you find. Keep a written record with breast changes or normal findings for each breast. By writing this information down, you do not need to depend only on memory for size, tenderness, or location. Write down where you are in your menstrual cycle, if you are still menstruating. If you are having trouble noticing differences   in your breasts, do not get discouraged. With time you will become more familiar with the variations in your breasts and more comfortable with the exam. How often should I examine my breasts? Examine your breasts every month. If you are breastfeeding, the best time to examine your breasts is after a feeding or after using a breast pump. If you menstruate, the best time to examine your breasts is 5-7 days after your period is over. During your period, your breasts are lumpier, and it may be more difficult to notice changes. When should I see my health care provider? See your health care provider if you notice:  A change in shape or size of your breasts or nipples.  A change in the skin of your breast or nipples, such as a reddened or scaly area.  Unusual discharge from your nipples.  A lump or thick area that was not there before.  Pain in your breasts.  Anything that concerns you.  Complicated Grief Grief is a normal response to the death of someone close to you. Feelings of fear, anger, and guilt can affect almost everyone who loses a loved one. It is also common to have symptoms of depression  while you are grieving. These include problems with sleep, loss of appetite, and lack of energy. They may last for weeks or months after a loss. Complicated grief is different from normal grief or depression. Normal grieving involves sadness and feelings of loss, but those feelings get better and heal over time. Complicated grief is a severe type of grief that lasts for a long time, usually for several months to a year or longer. It interferes with your ability to function normally. Complicated grief may require treatment from a mental health care provider. What are the causes? The cause of this condition is not known. It is not clear why some people continue to struggle with grief and others do not. What increases the risk? You are more likely to develop this condition if:  The death of your loved one was sudden or unexpected.  The death of your loved one was due to a violent event.  Your loved one died from suicide.  Your loved one was a child or a young person.  You were very close to your loved one, or you were dependent on him or her.  You have a history of depression or anxiety. What are the signs or symptoms? Symptoms of this condition include:  Feeling disbelief or having a lack of emotion (numbness).  Being unable to enjoy good memories of your loved one.  Needing to avoid anything or anyone that reminds you of your loved one.  Being unable to stop thinking about the death.  Feeling intense anger or guilt.  Feeling alone and hopeless.  Feeling that your life is meaningless and empty.  Losing the desire to move on with your life. How is this diagnosed? This condition may be diagnosed based on:  Your symptoms. Complicated grief will be diagnosed if you have ongoing symptoms of grief for 6-12 months or longer.  The effect of symptoms on your life. You may be diagnosed with this condition if your symptoms are interfering with your ability to live your life. Your health  care provider may recommend that you see a mental health care provider. Many symptoms of depression are similar to the symptoms of complicated grief. It is important to be evaluated for complicated grief along with other mental health conditions. How is this treated? This  condition is most commonly treated with talk therapy. This therapy is offered by a mental health specialist (psychiatrist). During therapy:  You will learn healthy ways to cope with the loss of your loved one.  Your mental health care provider may recommend antidepressant medicines. Follow these instructions at home: Lifestyle   Take care of yourself. ? Eat on a regular basis, and maintain a healthy diet. Eat plenty of fruits, vegetables, lean protein, and whole grains. ? Try to get some exercise each day. Aim for 30 minutes of exercise on most days of the week. ? Keep a consistent sleep schedule. Try to get 8 or more hours of sleep each night. ? Start doing the things that you used to enjoy.  Do not use drugs or alcohol to ease your symptoms.  Spend time with friends and loved ones. General instructions  Take over-the-counter and prescription medicines only as told by your health care provider.  Consider joining a grief (bereavement) support group to help you deal with your loss.  Keep all follow-up visits as told by your health care provider. This is important. Contact a health care provider if:  Your symptoms prevent you from functioning normally.  Your symptoms do not get better with treatment. Get help right away if:  You have serious thoughts about hurting yourself or someone else.  You have suicidal feelings. If you ever feel like you may hurt yourself or others, or have thoughts about taking your own life, get help right away. You can go to your nearest emergency department or call:  Your local emergency services (911 in the [place]  A suicide crisis helpline, such as the [facility] at 228-693-9396. This is open 24 hours a day. Summary  Complicated grief is a severe type of grief that lasts for a long time. This grief is not likely to go away on its own. Get the help you need.  Some griefs are more difficult than others and can cause this condition. You may need a certain type of treatment to help you recover if the loss of your loved one was sudden, violent, or due to suicide.  You may feel guilty about moving on with your life. Getting help does not mean that you are forgetting your loved one. It means that you are taking care of yourself.  Complicated grief is best treated with talk therapy. Medicines may also be prescribed.  Seek the help you need, and find support that will help you recover. This information is not intended to replace advice given to you by your health care provider. Make sure you discuss any questions you have with your health care provider. Document Revised: 08/06/2017 Document Reviewed: 06/09/2017 Elsevier Patient Education  [place] is the normal time of life before and after menstrual periods stop completely (menopause). Perimenopause can begin 2-8 years before menopause, and it usually lasts for 1 year after menopause. During perimenopause, the ovaries may or may not produce an egg. What are the causes? This condition is caused by a natural change in hormone levels that happens as you get older. What increases the risk? This condition is more likely to start at an earlier age if you have certain medical conditions or treatments, including:  A tumor of the pituitary gland in the brain.  A disease that affects the ovaries and hormone production.  Radiation treatment for cancer.  Certain cancer treatments, such as chemotherapy or hormone (anti-estrogen) therapy.  Heavy smoking  and excessive alcohol use.  Family history of early menopause. What are the signs or  symptoms? Perimenopausal changes affect each woman differently. Symptoms of this condition may include:  Hot flashes.  Night sweats.  Irregular menstrual periods.  Decreased sex drive.  Vaginal dryness.  Headaches.  Mood swings.  Depression.  Memory problems or trouble concentrating.  Irritability.  Tiredness.  Weight gain.  Anxiety.  Trouble getting pregnant. How is this diagnosed? This condition is diagnosed based on your medical history, a physical exam, your age, your menstrual history, and your symptoms. Hormone tests may also be done. How is this treated? In some cases, no treatment is needed. You and your health care provider should make a decision together about whether treatment is necessary. Treatment will be based on your individual condition and preferences. Various treatments are available, such as:  Menopausal hormone therapy (MHT).  Medicines to treat specific symptoms.  Acupuncture.  Vitamin or herbal supplements. Before starting treatment, make sure to let your health care provider know if you have a personal or family history of:  Heart disease.  Breast cancer.  Blood clots.  Diabetes.  Osteoporosis. Follow these instructions at home: Lifestyle  Do not use any products that contain nicotine or tobacco, such as cigarettes and e-cigarettes. If you need help quitting, ask your health care provider.  Eat a balanced diet that includes fresh fruits and vegetables, whole grains, soybeans, eggs, lean meat, and low-fat dairy.  Get at least 30 minutes of physical activity on 5 or more days each week.  Avoid alcoholic and caffeinated beverages, as well as spicy foods. This may help prevent hot flashes.  Get 7-8 hours of sleep each night.  Dress in layers that can be removed to help you manage hot flashes.  Find ways to manage stress, such as deep breathing, meditation, or journaling. General instructions  Keep track of your menstrual  periods, including: ? When they occur. ? How heavy they are and how long they last. ? How much time passes between periods.  Keep track of your symptoms, noting when they start, how often you have them, and how long they last.  Take over-the-counter and prescription medicines only as told by your health care provider.  Take vitamin supplements only as told by your health care provider. These may include calcium, vitamin E, and vitamin D.  Use vaginal lubricants or moisturizers to help with vaginal dryness and improve comfort during sex.  Talk with your health care provider before starting any herbal supplements.  Keep all follow-up visits as told by your health care provider. This is important. This includes any group therapy or counseling. Contact a health care provider if:  You have heavy vaginal bleeding or pass blood clots.  Your period lasts more than 2 days longer than normal.  Your periods are recurring sooner than 21 days.  You bleed after having sex. Get help right away if:  You have chest pain, trouble breathing, or trouble talking.  You have severe depression.  You have pain when you urinate.  You have severe headaches.  You have vision problems. Summary  Perimenopause is the time when a woman's body begins to move into menopause. This may happen naturally or as a result of other health problems or medical treatments.  Perimenopause can begin 2-8 years before menopause, and it usually lasts for 1 year after menopause.  Perimenopausal symptoms can be managed through medicines, lifestyle changes, and complementary therapies such as acupuncture. This information is not  intended to replace advice given to you by your health care provider. Make sure you discuss any questions you have with your health care provider. Document Revised: 08/06/2017 Document Reviewed: 09/29/2016 Elsevier Patient Education  2020 [employer].

## 2020-07-05 ENCOUNTER — Ambulatory Visit: Payer: 59 | Admitting: Obstetrics and Gynecology

## 2020-08-06 ENCOUNTER — Other Ambulatory Visit: Payer: Self-pay

## 2020-08-06 ENCOUNTER — Ambulatory Visit
Admission: RE | Admit: 2020-08-06 | Discharge: 2020-08-06 | Disposition: A | Discharge status: Home / Self Care | Payer: 59 | Source: Ambulatory Visit | Attending: Family Medicine | Admitting: Family Medicine

## 2020-08-06 DIAGNOSIS — Z1231 Encounter for screening mammogram for malignant neoplasm of breast: Secondary | ICD-10-CM

## 2020-10-28 DIAGNOSIS — D171 Benign lipomatous neoplasm of skin and subcutaneous tissue of trunk: Secondary | ICD-10-CM | POA: Diagnosis not present

## 2021-04-17 DIAGNOSIS — U071 COVID-19: Secondary | ICD-10-CM | POA: Diagnosis not present

## 2021-07-07 ENCOUNTER — Ambulatory Visit: Payer: 59 | Admitting: Obstetrics and Gynecology

## 2021-07-07 DIAGNOSIS — Z0289 Encounter for other administrative examinations: Secondary | ICD-10-CM

## 2021-07-07 NOTE — Progress Notes (Deleted)
55 y.o. [id number] Widowed Black or African American Not Hispanic or Latino female here for annual exam.      No LMP recorded.          Sexually active: {yes no:314532}  The current method of family planning is {contraception:315051}.    Exercising: {yes no:314532}  {types:19826} Smoker:  {YES NO:22349}  Health Maintenance: Pap:  04-27-18 neg HPV HR neg             04-05-15 neg HPV HR neg  History of abnormal Pap:  no MMG:  08/06/20 density D Bi-rads 1 neg  BMD:   never  Colonoscopy: 2018 f/u 10 years  TDaP:  01/06/2019 Gardasil: n/a   reports that she has never smoked. She has never used smokeless tobacco. She reports that she does not drink alcohol and does not use drugs.  Past Medical History:  Diagnosis Date   Allergic rhinitis, cause unspecified    Chest pain, unspecified    Dysmenorrhea    Fibrocystic breast    Hyperthyroidism    reported as borderline by Dr. Vashti Hey   Impaired fasting glucose    Leukocytopenia, unspecified    Palpitations    Subacute thyroiditis    Tinnitus    Unspecified hereditary and idiopathic peripheral neuropathy     Past Surgical History:  Procedure Laterality Date   BREAST BIOPSY Left 10/11/2009   for fibrocystic breast times 2   BREAST EXCISIONAL BIOPSY Left 2003   benign   BREAST EXCISIONAL BIOPSY Right 1993   benign   CESAREAN SECTION     times 2   TONSILLECTOMY     TUBAL LIGATION      Current Outpatient Medications  Medication Sig Dispense Refill   albuterol (VENTOLIN HFA) 108 (90 Base) MCG/ACT inhaler Inhale 2 puffs into the lungs as needed.     Ascorbic Acid (VITAMIN C) 100 MG tablet Take 100 mg by mouth daily.     cyanocobalamin 100 MCG tablet Take 100 mcg by mouth daily.     GLUCOSAMINE-CHONDROITIN PO Take by mouth.     montelukast (SINGULAIR) 10 MG tablet Take 10 mg by mouth as needed.     Multiple Vitamin (CALCIUM COMPLEX PO) Take by mouth.     Multiple Vitamins-Minerals (CENTRUM ADULTS PO) Take by mouth.     VITAMIN  D PO Take by mouth.     No current facility-administered medications for this visit.    Family History  Problem Relation Age of Onset   Heart murmur Maternal Aunt    Diabetes Maternal Grandmother    Hypertension Maternal Grandmother    Stroke Maternal Grandmother    Migraines Maternal Grandmother    Heart attack Paternal Grandfather    Heart murmur Maternal Aunt    Heart murmur Daughter    Heart murmur Sister     Review of Systems  Exam:   There were no vitals taken for this visit.  Weight change: @WEIGHTCHANGE @ Height:      Ht Readings from Last 3 Encounters:  06/28/20 5' 5.25" (1.657 m)  06/26/19 5' 5.25" (1.657 m)  04/27/18 5' 5.25" (1.657 m)    General appearance: alert, cooperative and appears stated age Head: Normocephalic, without obvious abnormality, atraumatic Neck: no adenopathy, supple, symmetrical, trachea midline and thyroid {CHL AMB PHY EX THYROID NORM DEFAULT:[phone]::"normal to inspection and palpation"} Lungs: clear to auscultation bilaterally Cardiovascular: regular rate and rhythm Breasts: {Exam; breast:13139::"normal appearance, no masses or tenderness"} Abdomen: soft, non-tender; non distended,  no masses,  no  organomegaly Extremities: extremities normal, atraumatic, no cyanosis or edema Skin: Skin color, texture, turgor normal. No rashes or lesions Lymph nodes: Cervical, supraclavicular, and axillary nodes normal. No abnormal inguinal nodes palpated Neurologic: Grossly normal   Pelvic: External genitalia:  no lesions              Urethra:  normal appearing urethra with no masses, tenderness or lesions              Bartholins and Skenes: normal                 Vagina: normal appearing vagina with normal color and discharge, no lesions              Cervix: {CHL AMB PHY EX CERVIX NORM [phone]::"no lesions"}               Bimanual Exam:  Uterus:  {CHL AMB PHY EX UTERUS NORM [phone]::"normal size, contour, position,  consistency, mobility, non-tender"}              Adnexa: {CHL AMB PHY EX ADNEXA NO MASS DEFAULT:[phone]::"no mass, fullness, tenderness"}               Rectovaginal: Confirms               Anus:  normal sphincter tone, no lesions  *** chaperoned for the exam.  A:  Well Woman with normal exam  P:

## 2021-07-14 DIAGNOSIS — D72819 Decreased white blood cell count, unspecified: Secondary | ICD-10-CM | POA: Diagnosis not present

## 2021-07-14 DIAGNOSIS — Z0000 Encounter for general adult medical examination without abnormal findings: Secondary | ICD-10-CM | POA: Diagnosis not present

## 2021-07-14 DIAGNOSIS — E559 Vitamin D deficiency, unspecified: Secondary | ICD-10-CM | POA: Diagnosis not present

## 2021-07-14 DIAGNOSIS — R7303 Prediabetes: Secondary | ICD-10-CM | POA: Diagnosis not present

## 2021-07-14 DIAGNOSIS — G43909 Migraine, unspecified, not intractable, without status migrainosus: Secondary | ICD-10-CM | POA: Diagnosis not present

## 2021-07-14 DIAGNOSIS — Z23 Encounter for immunization: Secondary | ICD-10-CM | POA: Diagnosis not present

## 2021-07-14 DIAGNOSIS — R7989 Other specified abnormal findings of blood chemistry: Secondary | ICD-10-CM | POA: Diagnosis not present

## 2021-07-14 DIAGNOSIS — J309 Allergic rhinitis, unspecified: Secondary | ICD-10-CM | POA: Diagnosis not present

## 2021-09-11 DIAGNOSIS — E0590 Thyrotoxicosis, unspecified without thyrotoxic crisis or storm: Secondary | ICD-10-CM | POA: Diagnosis not present

## 2021-09-11 DIAGNOSIS — D72819 Decreased white blood cell count, unspecified: Secondary | ICD-10-CM | POA: Diagnosis not present

## 2021-09-11 DIAGNOSIS — R7301 Impaired fasting glucose: Secondary | ICD-10-CM | POA: Diagnosis not present

## 2021-09-16 ENCOUNTER — Other Ambulatory Visit: Payer: Self-pay | Admitting: Family Medicine

## 2021-09-16 DIAGNOSIS — Z1231 Encounter for screening mammogram for malignant neoplasm of breast: Secondary | ICD-10-CM

## 2021-09-16 DIAGNOSIS — G609 Hereditary and idiopathic neuropathy, unspecified: Secondary | ICD-10-CM | POA: Diagnosis not present

## 2021-09-16 DIAGNOSIS — E0590 Thyrotoxicosis, unspecified without thyrotoxic crisis or storm: Secondary | ICD-10-CM | POA: Diagnosis not present

## 2021-09-16 DIAGNOSIS — D72819 Decreased white blood cell count, unspecified: Secondary | ICD-10-CM | POA: Diagnosis not present

## 2021-09-16 DIAGNOSIS — R7301 Impaired fasting glucose: Secondary | ICD-10-CM | POA: Diagnosis not present

## 2021-10-03 ENCOUNTER — Ambulatory Visit
Admission: RE | Admit: 2021-10-03 | Discharge: 2021-10-03 | Disposition: A | Discharge status: Home / Self Care | Payer: Self-pay | Source: Ambulatory Visit | Attending: Family Medicine | Admitting: Family Medicine

## 2021-10-03 DIAGNOSIS — M25519 Pain in unspecified shoulder: Secondary | ICD-10-CM | POA: Diagnosis not present

## 2021-10-03 DIAGNOSIS — Z1231 Encounter for screening mammogram for malignant neoplasm of breast: Secondary | ICD-10-CM | POA: Diagnosis not present

## 2021-10-07 ENCOUNTER — Other Ambulatory Visit: Payer: Self-pay | Admitting: Family Medicine

## 2021-10-07 DIAGNOSIS — R928 Other abnormal and inconclusive findings on diagnostic imaging of breast: Secondary | ICD-10-CM

## 2021-10-27 DIAGNOSIS — H6982 Other specified disorders of Eustachian tube, left ear: Secondary | ICD-10-CM | POA: Diagnosis not present

## 2021-12-04 DIAGNOSIS — M25511 Pain in right shoulder: Secondary | ICD-10-CM | POA: Diagnosis not present

## 2022-01-10 ENCOUNTER — Ambulatory Visit
Admission: RE | Admit: 2022-01-10 | Discharge: 2022-01-10 | Disposition: A | Discharge status: Home / Self Care | Payer: Managed Care, Other (non HMO) | Source: Ambulatory Visit | Attending: Family Medicine | Admitting: Family Medicine

## 2022-01-10 ENCOUNTER — Other Ambulatory Visit: Payer: Self-pay | Admitting: Family Medicine

## 2022-01-10 DIAGNOSIS — R921 Mammographic calcification found on diagnostic imaging of breast: Secondary | ICD-10-CM

## 2022-01-10 DIAGNOSIS — R928 Other abnormal and inconclusive findings on diagnostic imaging of breast: Secondary | ICD-10-CM

## 2022-01-14 ENCOUNTER — Ambulatory Visit
Admission: RE | Admit: 2022-01-14 | Discharge: 2022-01-14 | Disposition: A | Discharge status: Home / Self Care | Payer: Managed Care, Other (non HMO) | Source: Ambulatory Visit | Attending: Family Medicine | Admitting: Family Medicine

## 2022-01-14 DIAGNOSIS — R921 Mammographic calcification found on diagnostic imaging of breast: Secondary | ICD-10-CM

## 2022-01-21 ENCOUNTER — Other Ambulatory Visit: Payer: Self-pay | Admitting: Family Medicine

## 2022-01-21 DIAGNOSIS — R921 Mammographic calcification found on diagnostic imaging of breast: Secondary | ICD-10-CM

## 2022-01-29 ENCOUNTER — Ambulatory Visit
Admission: RE | Admit: 2022-01-29 | Discharge: 2022-01-29 | Disposition: A | Discharge status: Home / Self Care | Payer: Managed Care, Other (non HMO) | Source: Ambulatory Visit | Attending: Family Medicine | Admitting: Family Medicine

## 2022-01-29 ENCOUNTER — Other Ambulatory Visit: Payer: Self-pay | Admitting: Family Medicine

## 2022-01-29 DIAGNOSIS — R921 Mammographic calcification found on diagnostic imaging of breast: Secondary | ICD-10-CM

## 2022-02-13 NOTE — Progress Notes (Deleted)
56 y.o. [id number] Widowed Black or African American Not Hispanic or Latino female here for annual exam.      No LMP recorded.          Sexually active: {yes no:314532}  The current method of family planning is {contraception:315051}.    Exercising: {yes no:314532}  {types:19826} Smoker:  {YES NO:22349}  Health Maintenance: Pap:  04-27-18 neg HPV HR neg             04-05-15 neg HPV HR neg History of abnormal Pap:  no MMG:  see epic  BMD:   none Colonoscopy: 2018 f/u 10 years TDaP:  01/06/2019 Gardasil: n/a   reports that she has never smoked. She has never used smokeless tobacco. She reports that she does not drink alcohol and does not use drugs.  Past Medical History:  Diagnosis Date   Allergic rhinitis, cause unspecified    Chest pain, unspecified    Dysmenorrhea    Fibrocystic breast    Hyperthyroidism    reported as borderline by Dr. Vashti Hey   Impaired fasting glucose    Leukocytopenia, unspecified    Palpitations    Subacute thyroiditis    Tinnitus    Unspecified hereditary and idiopathic peripheral neuropathy     Past Surgical History:  Procedure Laterality Date   BREAST BIOPSY Left 10/11/2009   for fibrocystic breast times 2   BREAST EXCISIONAL BIOPSY Left 2003   benign   BREAST EXCISIONAL BIOPSY Right 1993   benign   CESAREAN SECTION     times 2   TONSILLECTOMY     TUBAL LIGATION      Current Outpatient Medications  Medication Sig Dispense Refill   albuterol (VENTOLIN HFA) 108 (90 Base) MCG/ACT inhaler Inhale 2 puffs into the lungs as needed.     Ascorbic Acid (VITAMIN C) 100 MG tablet Take 100 mg by mouth daily.     cyanocobalamin 100 MCG tablet Take 100 mcg by mouth daily.     GLUCOSAMINE-CHONDROITIN PO Take by mouth.     montelukast (SINGULAIR) 10 MG tablet Take 10 mg by mouth as needed.     Multiple Vitamin (CALCIUM COMPLEX PO) Take by mouth.     Multiple Vitamins-Minerals (CENTRUM ADULTS PO) Take by mouth.     VITAMIN D PO Take by mouth.     No  current facility-administered medications for this visit.    Family History  Problem Relation Age of Onset   Heart murmur Maternal Aunt    Diabetes Maternal Grandmother    Hypertension Maternal Grandmother    Stroke Maternal Grandmother    Migraines Maternal Grandmother    Heart attack Paternal Grandfather    Heart murmur Maternal Aunt    Heart murmur Daughter    Heart murmur Sister     Review of Systems  Exam:   There were no vitals taken for this visit.  Weight change: '@WEIGHTCHANGE'$ @ Height:      Ht Readings from Last 3 Encounters:  06/28/20 5' 5.25" (1.657 m)  06/26/19 5' 5.25" (1.657 m)  04/27/18 5' 5.25" (1.657 m)    General appearance: alert, cooperative and appears stated age Head: Normocephalic, without obvious abnormality, atraumatic Neck: no adenopathy, supple, symmetrical, trachea midline and thyroid {CHL AMB PHY EX THYROID NORM DEFAULT:[phone]::"normal to inspection and palpation"} Lungs: clear to auscultation bilaterally Cardiovascular: regular rate and rhythm Breasts: {Exam; breast:13139::"normal appearance, no masses or tenderness"} Abdomen: soft, non-tender; non distended,  no masses,  no organomegaly Extremities: extremities normal, atraumatic, no cyanosis  or edema Skin: Skin color, texture, turgor normal. No rashes or lesions Lymph nodes: Cervical, supraclavicular, and axillary nodes normal. No abnormal inguinal nodes palpated Neurologic: Grossly normal   Pelvic: External genitalia:  no lesions              Urethra:  normal appearing urethra with no masses, tenderness or lesions              Bartholins and Skenes: normal                 Vagina: normal appearing vagina with normal color and discharge, no lesions              Cervix: {CHL AMB PHY EX CERVIX NORM [phone]::"no lesions"}               Bimanual Exam:  Uterus:  {CHL AMB PHY EX UTERUS NORM [phone]::"normal size, contour, position, consistency, mobility, non-tender"}               Adnexa: {CHL AMB PHY EX ADNEXA NO MASS [phone]::"no mass, fullness, tenderness"}               Rectovaginal: Confirms               Anus:  normal sphincter tone, no lesions  *** chaperoned for the exam.  A:  Well Woman with normal exam  P:

## 2022-02-26 ENCOUNTER — Ambulatory Visit: Payer: Self-pay | Admitting: Obstetrics and Gynecology

## 2022-03-03 NOTE — Progress Notes (Deleted)
56 y.o. [id number] Widowed Black or African American Not Hispanic or Latino female here for annual exam.      No LMP recorded.          Sexually active: {yes no:314532}  The current method of family planning is {contraception:315051}.    Exercising: {yes no:314532}  {types:19826} Smoker:  {YES NO:22349}  Health Maintenance: Pap:    04-27-18 neg HPV HR neg             04-05-15 neg HPV HR neg History of abnormal Pap:  no MMG:  see epic  BMD:   none  Colonoscopy: 2018 f/u 10 years TDaP:  01/06/2019 Gardasil: n/a   reports that she has never smoked. She has never used smokeless tobacco. She reports that she does not drink alcohol and does not use drugs.  Past Medical History:  Diagnosis Date   Allergic rhinitis, cause unspecified    Chest pain, unspecified    Dysmenorrhea    Fibrocystic breast    Hyperthyroidism    reported as borderline by Dr. Vashti Hey   Impaired fasting glucose    Leukocytopenia, unspecified    Palpitations    Subacute thyroiditis    Tinnitus    Unspecified hereditary and idiopathic peripheral neuropathy     Past Surgical History:  Procedure Laterality Date   BREAST BIOPSY Left 10/11/2009   for fibrocystic breast times 2   BREAST EXCISIONAL BIOPSY Left 2003   benign   BREAST EXCISIONAL BIOPSY Right 1993   benign   CESAREAN SECTION     times 2   TONSILLECTOMY     TUBAL LIGATION      Current Outpatient Medications  Medication Sig Dispense Refill   albuterol (VENTOLIN HFA) 108 (90 Base) MCG/ACT inhaler Inhale 2 puffs into the lungs as needed.     Ascorbic Acid (VITAMIN C) 100 MG tablet Take 100 mg by mouth daily.     cyanocobalamin 100 MCG tablet Take 100 mcg by mouth daily.     GLUCOSAMINE-CHONDROITIN PO Take by mouth.     montelukast (SINGULAIR) 10 MG tablet Take 10 mg by mouth as needed.     Multiple Vitamin (CALCIUM COMPLEX PO) Take by mouth.     Multiple Vitamins-Minerals (CENTRUM ADULTS PO) Take by mouth.     VITAMIN D PO Take by mouth.      No current facility-administered medications for this visit.    Family History  Problem Relation Age of Onset   Heart murmur Maternal Aunt    Diabetes Maternal Grandmother    Hypertension Maternal Grandmother    Stroke Maternal Grandmother    Migraines Maternal Grandmother    Heart attack Paternal Grandfather    Heart murmur Maternal Aunt    Heart murmur Daughter    Heart murmur Sister     Review of Systems  Exam:   There were no vitals taken for this visit.  Weight change: '@WEIGHTCHANGE'$ @ Height:      Ht Readings from Last 3 Encounters:  06/28/20 5' 5.25" (1.657 m)  06/26/19 5' 5.25" (1.657 m)  04/27/18 5' 5.25" (1.657 m)    General appearance: alert, cooperative and appears stated age Head: Normocephalic, without obvious abnormality, atraumatic Neck: no adenopathy, supple, symmetrical, trachea midline and thyroid {CHL AMB PHY EX THYROID NORM DEFAULT:[phone]::"normal to inspection and palpation"} Lungs: clear to auscultation bilaterally Cardiovascular: regular rate and rhythm Breasts: {Exam; breast:13139::"normal appearance, no masses or tenderness"} Abdomen: soft, non-tender; non distended,  no masses,  no organomegaly Extremities: extremities normal,  atraumatic, no cyanosis or edema Skin: Skin color, texture, turgor normal. No rashes or lesions Lymph nodes: Cervical, supraclavicular, and axillary nodes normal. No abnormal inguinal nodes palpated Neurologic: Grossly normal   Pelvic: External genitalia:  no lesions              Urethra:  normal appearing urethra with no masses, tenderness or lesions              Bartholins and Skenes: normal                 Vagina: normal appearing vagina with normal color and discharge, no lesions              Cervix: {CHL AMB PHY EX CERVIX NORM DEFAULT:[phone]::"no lesions"}               Bimanual Exam:  Uterus:  {CHL AMB PHY EX UTERUS NORM DEFAULT:[phone]::"normal size, contour, position, consistency, mobility,  non-tender"}              Adnexa: {CHL AMB PHY EX ADNEXA NO MASS [phone]::"no mass, fullness, tenderness"}               Rectovaginal: Confirms               Anus:  normal sphincter tone, no lesions  *** chaperoned for the exam.  A:  Well Woman with normal exam  P:

## 2022-03-11 ENCOUNTER — Ambulatory Visit: Payer: Self-pay | Admitting: Obstetrics and Gynecology

## 2022-03-11 DIAGNOSIS — Z0289 Encounter for other administrative examinations: Secondary | ICD-10-CM

## 2022-03-24 NOTE — Progress Notes (Signed)
56 y.o. [id number] Widowed Black or African American Not Hispanic or Latino female here for annual exam. Pt does not want a breast exam. She had a cycle in ~3/23, prior cycle was 6-7 months prior. Before that her cycles were monthly. She is having vasomotor symptoms. She wakes up a few times a night, some of it is related to being hot. Occasionally wakes up drenched. She is having hot flashes during the day, vary from none to several times an hour. Vary in severity.  Her husband died in 2017/12/01.  She has been seeing someone, they have been sexually active, but not currently. She is still grieving the loss of her Husband. Her mother died in 2023/05/12, and her mother in law died in 2023-07-12, she is grieving for them as well. Her daughter's are both struggling. They are 21 and 20. Both still in school, one at [employer] and one at [employer].   No bowel or bladder c/o.   Patient's last menstrual period was 11/12/2021.          Sexually active: No.  The current method of family planning is tubal ligation   Exercising: Yes.    Gym/ health club routine includes cardio and light weights. Smoker:  no  Health Maintenance: Pap:   04-27-18 neg HPV HR neg             04-05-15 neg HPV HR neg History of abnormal Pap:  no MMG:  see epic. She just had a breast biopsy in 5/23. Needs repeat imaging in 6 months.  BMD:   none  Colonoscopy: December 01, 2016 f/u 10 years TDaP:  01/06/2019 Gardasil: n/a   reports that she has never smoked. She has never used smokeless tobacco. She reports that she does not drink alcohol and does not use drugs. She works at [employer]. 2 daughters.   Past Medical History:  Diagnosis Date   Allergic rhinitis, cause unspecified    Chest pain, unspecified    Dysmenorrhea    Fibrocystic breast    Hyperthyroidism    reported as borderline by Dr. Vashti Hey   Impaired fasting glucose    Leukocytopenia, unspecified    Palpitations    Subacute thyroiditis    Tinnitus    Unspecified hereditary  and idiopathic peripheral neuropathy     Past Surgical History:  Procedure Laterality Date   BREAST BIOPSY Left 10/11/2009   for fibrocystic breast times 2   BREAST EXCISIONAL BIOPSY Left Dec 01, 2001   benign   BREAST EXCISIONAL BIOPSY Right 1993   benign   CESAREAN SECTION     times 2   TONSILLECTOMY     TUBAL LIGATION      Current Outpatient Medications  Medication Sig Dispense Refill   albuterol (VENTOLIN HFA) 108 (90 Base) MCG/ACT inhaler Inhale 2 puffs into the lungs as needed.     Ascorbic Acid (VITAMIN C) 100 MG tablet Take 100 mg by mouth daily.     cyanocobalamin 100 MCG tablet Take 100 mcg by mouth daily.     GLUCOSAMINE-CHONDROITIN PO Take by mouth.     montelukast (SINGULAIR) 10 MG tablet Take 10 mg by mouth as needed.     Multiple Vitamin (CALCIUM COMPLEX PO) Take by mouth.     Multiple Vitamins-Minerals (CENTRUM ADULTS PO) Take by mouth.     VITAMIN D PO Take by mouth.     No current facility-administered medications for this visit.    Family History  Problem Relation Age of Onset  Heart murmur Maternal Aunt    Diabetes Maternal Grandmother    Hypertension Maternal Grandmother    Stroke Maternal Grandmother    Migraines Maternal Grandmother    Heart attack Paternal Grandfather    Heart murmur Maternal Aunt    Heart murmur Daughter    Heart murmur Sister     Review of Systems  Exam:   BP 132/80 (BP Location: Right Arm, Patient Position: Sitting, Cuff Size: Normal)   Ht '5\' 5"'$  (1.651 m)   Wt 149 lb (67.6 kg)   LMP 11/12/2021   BMI 24.79 kg/m   Weight change: '@WEIGHTCHANGE'$ @ Height:   Height: '5\' 5"'$  (165.1 cm)  Ht Readings from Last 3 Encounters:  04/01/22 '5\' 5"'$  (1.651 m)  06/28/20 5' 5.25" (1.657 m)  06/26/19 5' 5.25" (1.657 m)    General appearance: alert, cooperative and appears stated age Head: Normocephalic, without obvious abnormality, atraumatic Neck: no adenopathy, supple, symmetrical, trachea midline and thyroid normal to inspection and  palpation Lungs: clear to auscultation bilaterally Cardiovascular: regular rate and rhythm Breasts:  patient declined exam, recent imaging and biopsy Abdomen: soft, non-tender; non distended,  no masses,  no organomegaly Extremities: extremities normal, atraumatic, no cyanosis or edema Skin: Skin color, texture, turgor normal. No rashes or lesions Lymph nodes: Cervical, supraclavicular, and axillary nodes normal. No abnormal inguinal nodes palpated Neurologic: Grossly normal   Pelvic: External genitalia:  no lesions              Urethra:  normal appearing urethra with no masses, tenderness or lesions              Bartholins and Skenes: normal                 Vagina: mildly atrophic appearing vagina with normal color and discharge, no lesions              Cervix: no lesions               Bimanual Exam:  Uterus:  normal size, contour, position, consistency, mobility, non-tender              Adnexa: no mass, fullness, tenderness               Rectovaginal: Confirms               Anus:  normal sphincter tone, no lesions  Wandra Scot, RMA chaperoned for the exam.  1. Well woman exam Discussed breast self exam Discussed calcium and vit D intake Mammogram UTD Pap next year Colonoscopy UTD  2. Leukopenia, unspecified type - CBC  3. Screening, lipid - Lipid panel  4. Laboratory exam ordered as part of routine general medical examination - Comprehensive metabolic panel  5. Screening examination for STD (sexually transmitted disease) - RPR - HIV Antibody (routine testing w rflx) - Hepatitis C antibody - SURESWAB CT/NG/T. vaginalis  6. Elevated vitamin B12 level On B12, last level was elevated - Vitamin B12  7. Abnormal uterine bleeding (AUB) Patient is perimenopausal, went 6-7 months without bleeding, then bleed in 3/23 -Will return for an ultrasound, if lining is thickened will need a biopsy, if not will discuss cyclic provera  8. Perimenopausal vasomotor  symptoms Discussed behavior changes, avoiding triggers, herbal products. She will let me know if they are not tolerable.

## 2022-04-01 ENCOUNTER — Encounter: Payer: Self-pay | Admitting: Obstetrics and Gynecology

## 2022-04-01 ENCOUNTER — Ambulatory Visit (INDEPENDENT_AMBULATORY_CARE_PROVIDER_SITE_OTHER): Payer: Managed Care, Other (non HMO) | Admitting: Obstetrics and Gynecology

## 2022-04-01 VITALS — BP 132/80 | Ht 65.0 in | Wt 149.0 lb

## 2022-04-01 DIAGNOSIS — N951 Menopausal and female climacteric states: Secondary | ICD-10-CM

## 2022-04-01 DIAGNOSIS — Z113 Encounter for screening for infections with a predominantly sexual mode of transmission: Secondary | ICD-10-CM | POA: Diagnosis not present

## 2022-04-01 DIAGNOSIS — Z0000 Encounter for general adult medical examination without abnormal findings: Secondary | ICD-10-CM

## 2022-04-01 DIAGNOSIS — Z01419 Encounter for gynecological examination (general) (routine) without abnormal findings: Secondary | ICD-10-CM

## 2022-04-01 DIAGNOSIS — R748 Abnormal levels of other serum enzymes: Secondary | ICD-10-CM

## 2022-04-01 DIAGNOSIS — R7989 Other specified abnormal findings of blood chemistry: Secondary | ICD-10-CM

## 2022-04-01 DIAGNOSIS — Z13220 Encounter for screening for lipoid disorders: Secondary | ICD-10-CM | POA: Diagnosis not present

## 2022-04-01 DIAGNOSIS — N939 Abnormal uterine and vaginal bleeding, unspecified: Secondary | ICD-10-CM

## 2022-04-01 DIAGNOSIS — D72819 Decreased white blood cell count, unspecified: Secondary | ICD-10-CM

## 2022-04-01 NOTE — Patient Instructions (Signed)
Try estroven for hot flashes and estroven pm for night sweats.  Menopause Menopause is the normal time of a woman's life when menstrual periods stop completely. It marks the natural end to a woman's ability to become pregnant. It can be defined as the absence of a menstrual period for 12 months without another medical cause. The transition to menopause (perimenopause) most often happens between the ages of 62 and 12, and can last for many years. During perimenopause, hormone levels change in your body, which can cause symptoms and affect your health. Menopause may increase your risk for: Weakened bones (osteoporosis), which causes fractures. Depression. Hardening and narrowing of the arteries (atherosclerosis), which can cause heart attacks and strokes. What are the causes? This condition is usually caused by a natural change in hormone levels that happens as you get older. The condition may also be caused by changes that are not natural, including: Surgery to remove both ovaries (surgical menopause). Side effects from some medicines, such as chemotherapy used to treat cancer (chemical menopause). What increases the risk? This condition is more likely to start at an earlier age if you have certain medical conditions or have undergone treatments, including: A tumor of the pituitary gland in the brain. A disease that affects the ovaries and hormones. Certain cancer treatments, such as chemotherapy or hormone therapy, or radiation therapy on the pelvis. Heavy smoking and excessive alcohol use. Family history of early menopause. This condition is also more likely to develop earlier in women who are very thin. What are the signs or symptoms? Symptoms of this condition include: Hot flashes. Irregular menstrual periods. Night sweats. Changes in feelings about sex. This could be a decrease in sex drive or an increased discomfort around your sexuality. Vaginal dryness and thinning of the vaginal  walls. This may cause painful sex. Dryness of the skin and development of wrinkles. Headaches. Problems sleeping (insomnia). Mood swings or irritability. Memory problems. Weight gain. Hair growth on the face and chest. Bladder infections or problems with urinating. How is this diagnosed? This condition is diagnosed based on your medical history, a physical exam, your age, your menstrual history, and your symptoms. Hormone tests may also be done. How is this treated? In some cases, no treatment is needed. You and your health care provider should make a decision together about whether treatment is necessary. Treatment will be based on your individual condition and preferences. Treatment for this condition focuses on managing symptoms. Treatment may include: Menopausal hormone therapy (MHT). Medicines to treat specific symptoms or complications. Acupuncture. Vitamin or herbal supplements. Before starting treatment, make sure to let your health care provider know if you have a personal or family history of these conditions: Heart disease. Breast cancer. Blood clots. Diabetes. Osteoporosis. Follow these instructions at home: Lifestyle Do not use any products that contain nicotine or tobacco, such as cigarettes, e-cigarettes, and chewing tobacco. If you need help quitting, ask your health care provider. Get at least 30 minutes of physical activity on 5 or more days each week. Avoid alcoholic and caffeinated beverages, as well as spicy foods. This may help prevent hot flashes. Get 7-8 hours of sleep each night. If you have hot flashes, try: Dressing in layers. Avoiding things that may trigger hot flashes, such as spicy food, warm places, or stress. Taking slow, deep breaths when a hot flash starts. Keeping a fan in your home and office. Find ways to manage stress, such as deep breathing, meditation, or journaling. Consider going to group therapy  with other women who are having menopause  symptoms. Ask your health care provider about recommended group therapy meetings. Eating and drinking  Eat a healthy, balanced diet that contains whole grains, lean protein, low-fat dairy, and plenty of fruits and vegetables. Your health care provider may recommend adding more soy to your diet. Foods that contain soy include tofu, tempeh, and soy milk. Eat plenty of foods that contain calcium and vitamin D for bone health. Items that are rich in calcium include low-fat milk, yogurt, beans, almonds, sardines, broccoli, and kale. Medicines Take over-the-counter and prescription medicines only as told by your health care provider. Talk with your health care provider before starting any herbal supplements. If prescribed, take vitamins and supplements as told by your health care provider. General instructions  Keep track of your menstrual periods, including: When they occur. How heavy they are and how long they last. How much time passes between periods. Keep track of your symptoms, noting when they start, how often you have them, and how long they last. Use vaginal lubricants or moisturizers to help with vaginal dryness and improve comfort during sex. Keep all follow-up visits. This is important. This includes any group therapy or counseling. Contact a health care provider if: You are still having menstrual periods after age 57. You have pain during sex. You have not had a period for 12 months and you develop vaginal bleeding. Get help right away if you have: Severe depression. Excessive vaginal bleeding. Pain when you urinate. A fast or irregular heartbeat (palpitations). Severe headaches. Abdominal pain or severe indigestion. Summary Menopause is a normal time of life when menstrual periods stop completely. It is usually defined as the absence of a menstrual period for 12 months without another medical cause. The transition to menopause (perimenopause) most often happens between the ages  of 28 and 56 and can last for several years. Symptoms can be managed through medicines, lifestyle changes, and complementary therapies such as acupuncture. Eat a balanced diet that is rich in nutrients to promote bone health and heart health and to manage symptoms during menopause. This information is not intended to replace advice given to you by your health care provider. Make sure you discuss any questions you have with your health care provider. Document Revised: 05/24/2020 Document Reviewed: 02/08/2020 Elsevier Patient Education  [place]   We recommended that you start or continue a regular exercise program for good health. Physical activity is anything that gets your body moving, some is better than none. The CDC recommends 150 minutes per week of Moderate-Intensity Aerobic Activity and 2 or more days of Muscle Strengthening Activity.  Benefits of exercise are limitless: helps weight loss/weight maintenance, improves mood and energy, helps with depression and anxiety, improves sleep, tones and strengthens muscles, improves balance, improves bone density, protects from chronic conditions such as heart disease, high blood pressure and diabetes and so much more. To learn more visit: WhyNotPoker.uy  DIET: Good nutrition starts with a healthy diet of fruits, vegetables, whole grains, and lean protein sources. Drink plenty of water for hydration. Minimize empty calories, sodium, sweets. For more information about dietary recommendations visit: GeekRegister.com.ee and http://schaefer-mitchell.com/  ALCOHOL:  Women should limit their alcohol intake to no more than 7 drinks/beers/glasses of wine (combined, not each!) per week. Moderation of alcohol intake to this level decreases your risk of breast cancer and liver damage.  If you are concerned that you may have a problem, or your friends have told  you they are concerned about your drinking, there are many resources to help. A well-known program that is free, effective, and available to all people all over the nation is Alcoholics Anonymous.  Check out this site to learn more: BlockTaxes.se   CALCIUM AND VITAMIN D:  Adequate intake of calcium and Vitamin D are recommended for bone health.  You should be getting between 1000-1200 mg of calcium and 800 units of Vitamin D daily between diet and supplements  PAP SMEARS:  Pap smears, to check for cervical cancer or precancers,  have traditionally been done yearly, scientific advances have shown that most women can have pap smears less often.  However, every woman still should have a physical exam from her gynecologist every year. It will include a breast check, inspection of the vulva and vagina to check for abnormal growths or skin changes, a visual exam of the cervix, and then an exam to evaluate the size and shape of the uterus and ovaries. We will also provide age appropriate advice regarding health maintenance, like when you should have certain vaccines, screening for sexually transmitted diseases, bone density testing, colonoscopy, mammograms, etc.   MAMMOGRAMS:  All women over 37 years old should have a routine mammogram.   COLON CANCER SCREENING: Now recommend starting at age 63. At this time colonoscopy is not covered for routine screening until 50. There are take home tests that can be done between 45-49.   COLONOSCOPY:  Colonoscopy to screen for colon cancer is recommended for all women at age 32.  We know, you hate the idea of the prep.  We agree, BUT, having colon cancer and not knowing it is worse!!  Colon cancer so often starts as a polyp that can be seen and removed at colonscopy, which can quite literally save your life!  And if your first colonoscopy is normal and you have no family history of colon cancer, most women don't have to have it again for 10 years.  Once every ten  years, you can do something that may end up saving your life, right?  We will be happy to help you get it scheduled when you are ready.  Be sure to check your insurance coverage so you understand how much it will cost.  It may be covered as a preventative service at no cost, but you should check your particular policy.      Breast Self-Awareness Breast self-awareness means being familiar with how your breasts look and feel. It involves checking your breasts regularly and reporting any changes to your health care provider. Practicing breast self-awareness is important. A change in your breasts can be a sign of a serious medical problem. Being familiar with how your breasts look and feel allows you to find any problems early, when treatment is more likely to be successful. All women should practice breast self-awareness, including women who have had breast implants. How to do a breast self-exam One way to learn what is normal for your breasts and whether your breasts are changing is to do a breast self-exam. To do a breast self-exam: Look for Changes  Remove all the clothing above your waist. Stand in front of a mirror in a room with good lighting. Put your hands on your hips. Push your hands firmly downward. Compare your breasts in the mirror. Look for differences between them (asymmetry), such as: Differences in shape. Differences in size. Puckers, dips, and bumps in one breast and not the other. Look at each breast for  changes in your skin, such as: Redness. Scaly areas. Look for changes in your nipples, such as: Discharge. Bleeding. Dimpling. Redness. A change in position. Feel for Changes Carefully feel your breasts for lumps and changes. It is best to do this while lying on your back on the floor and again while sitting or standing in the shower or tub with soapy water on your skin. Feel each breast in the following way: Place the arm on the side of the breast you are examining  above your head. Feel your breast with the other hand. Start in the nipple area and make  inch (2 cm) overlapping circles to feel your breast. Use the pads of your three middle fingers to do this. Apply light pressure, then medium pressure, then firm pressure. The light pressure will allow you to feel the tissue closest to the skin. The medium pressure will allow you to feel the tissue that is a little deeper. The firm pressure will allow you to feel the tissue close to the ribs. Continue the overlapping circles, moving downward over the breast until you feel your ribs below your breast. Move one finger-width toward the center of the body. Continue to use the  inch (2 cm) overlapping circles to feel your breast as you move slowly up toward your collarbone. Continue the up and down exam using all three pressures until you reach your armpit.  Write Down What You Find  Write down what is normal for each breast and any changes that you find. Keep a written record with breast changes or normal findings for each breast. By writing this information down, you do not need to depend only on memory for size, tenderness, or location. Write down where you are in your menstrual cycle, if you are still menstruating. If you are having trouble noticing differences in your breasts, do not get discouraged. With time you will become more familiar with the variations in your breasts and more comfortable with the exam. How often should I examine my breasts? Examine your breasts every month. If you are breastfeeding, the best time to examine your breasts is after a feeding or after using a breast pump. If you menstruate, the best time to examine your breasts is 5-7 days after your period is over. During your period, your breasts are lumpier, and it may be more difficult to notice changes. When should I see my health care provider? See your health care provider if you notice: A change in shape or size of your breasts or  nipples. A change in the skin of your breast or nipples, such as a reddened or scaly area. Unusual discharge from your nipples. A lump or thick area that was not there before. Pain in your breasts. Anything that concerns you.

## 2022-04-02 ENCOUNTER — Ambulatory Visit (INDEPENDENT_AMBULATORY_CARE_PROVIDER_SITE_OTHER): Payer: Managed Care, Other (non HMO)

## 2022-04-02 DIAGNOSIS — N939 Abnormal uterine and vaginal bleeding, unspecified: Secondary | ICD-10-CM | POA: Diagnosis not present

## 2022-04-02 LAB — CBC
HCT: 40.9 % (ref 35.0–45.0)
Hemoglobin: 13.2 g/dL (ref 11.7–15.5)
MCH: 26.7 pg — ABNORMAL LOW (ref 27.0–33.0)
MCHC: 32.3 g/dL (ref 32.0–36.0)
MCV: 82.8 fL (ref 80.0–100.0)
MPV: 10.8 fL (ref 7.5–12.5)
Platelets: 260 10*3/uL (ref 140–400)
RBC: 4.94 10*6/uL (ref 3.80–5.10)
RDW: 13.3 % (ref 11.0–15.0)
WBC: 4.1 10*3/uL (ref 3.8–10.8)

## 2022-04-02 LAB — COMPREHENSIVE METABOLIC PANEL
AG Ratio: 1.5 (calc) (ref 1.0–2.5)
ALT: 21 U/L (ref 6–29)
AST: 23 U/L (ref 10–35)
Albumin: 4.4 g/dL (ref 3.6–5.1)
Alkaline phosphatase (APISO): 48 U/L (ref 37–153)
BUN: 21 mg/dL (ref 7–25)
CO2: 26 mmol/L (ref 20–32)
Calcium: 10 mg/dL (ref 8.6–10.4)
Chloride: 100 mmol/L (ref 98–110)
Creat: 0.73 mg/dL (ref 0.50–1.03)
Globulin: 2.9 g/dL (calc) (ref 1.9–3.7)
Glucose, Bld: 71 mg/dL (ref 65–99)
Potassium: 4 mmol/L (ref 3.5–5.3)
Sodium: 137 mmol/L (ref 135–146)
Total Bilirubin: 0.4 mg/dL (ref 0.2–1.2)
Total Protein: 7.3 g/dL (ref 6.1–8.1)

## 2022-04-02 LAB — SURESWAB CT/NG/T. VAGINALIS
C. trachomatis RNA, TMA: NOT DETECTED
N. gonorrhoeae RNA, TMA: NOT DETECTED
Trichomonas vaginalis RNA: NOT DETECTED

## 2022-04-02 LAB — LIPID PANEL
Cholesterol: 199 mg/dL (ref <200)
HDL: 94 mg/dL (ref >=50)
LDL Cholesterol (Calc): 89 mg/dL (calc)
Non-HDL Cholesterol (Calc): 105 mg/dL (calc) (ref <130)
Total CHOL/HDL Ratio: 2.1 (calc) (ref <5.0)
Triglycerides: 75 mg/dL (ref <150)

## 2022-04-02 LAB — EXTRA SPECIMEN

## 2022-04-02 LAB — HEPATITIS C ANTIBODY: Hepatitis C Ab: NONREACTIVE

## 2022-04-02 LAB — HIV ANTIBODY (ROUTINE TESTING W REFLEX): HIV 1&2 Ab, 4th Generation: NONREACTIVE

## 2022-04-02 LAB — VITAMIN B12: Vitamin B-12: 1814 pg/mL — ABNORMAL HIGH (ref 200–1100)

## 2022-04-02 LAB — RPR: RPR Ser Ql: NONREACTIVE

## 2022-04-03 ENCOUNTER — Telehealth: Payer: Self-pay | Admitting: Obstetrics and Gynecology

## 2022-04-03 NOTE — Telephone Encounter (Signed)
Please let the patient know that her ultrasound is normal. I think she is still perimenopausal.  I would recommend that she take provera 5 mg x 5 days every other month for the next 6 months and let me know if she has any bleeding. Basically the provera will stabilize the lining of the uterus and after she stops it, any build up will come out (bleeding). This will prevent abnormal overgrowth of the lining of the uterus and help prevent problems going forward.

## 2022-04-06 MED ORDER — MEDROXYPROGESTERONE ACETATE 5 MG PO TABS: ORAL_TABLET | ORAL | 1 refills | Status: DC

## 2022-04-06 NOTE — Telephone Encounter (Signed)
Patient informed. Rx sent 

## 2022-06-29 ENCOUNTER — Other Ambulatory Visit: Payer: Self-pay | Admitting: Family Medicine

## 2022-06-29 DIAGNOSIS — R928 Other abnormal and inconclusive findings on diagnostic imaging of breast: Secondary | ICD-10-CM

## 2022-07-09 ENCOUNTER — Ambulatory Visit
Admission: RE | Admit: 2022-07-09 | Discharge: 2022-07-09 | Disposition: A | Discharge status: Home / Self Care | Payer: Self-pay | Source: Ambulatory Visit | Attending: Family Medicine | Admitting: Family Medicine

## 2022-07-09 DIAGNOSIS — R928 Other abnormal and inconclusive findings on diagnostic imaging of breast: Secondary | ICD-10-CM

## 2022-07-10 ENCOUNTER — Other Ambulatory Visit: Payer: Self-pay | Admitting: Family Medicine

## 2022-07-10 DIAGNOSIS — Z9889 Other specified postprocedural states: Secondary | ICD-10-CM

## 2022-10-08 IMAGING — MG MM BREAST BX W/ LOC DEV 1ST LESION IMAGE BX SPEC STEREO GUIDE*R*
7 of 17 series · 7 of 40 positions shown · non-contrast
Comparison: PREVIOUS EXAMINATIONS.
COMPARISON: PREVIOUS EXAMINATIONS.

Addendum:
CLINICAL DATA: 7 mm group of indeterminate calcifications in the
upper-outer quadrant of the right breast and 6 mm group of
indeterminate calcifications in the upper-outer quadrant of the left
breast at recent mammography. Biopsies of these areas were attempted
on 01/14/2022. However, no calcifications could be obtained on the
right during the biopsy and calcifications were not seen in the
specimens on the left at pathology. A biopsy marker clip was not
placed on the right at the time of previous biopsy due to technical
difficulties. A ribbon shaped clip was placed on the left and was
located 1 cm superior to the targeted calcifications.

EXAM:
RIGHT BREAST STEREOTACTIC CORE NEEDLE BIOPSY
LEFT BREAST STEREOTACTIC CORE NEEDLE BIOPSY

[R (1 of 5)]
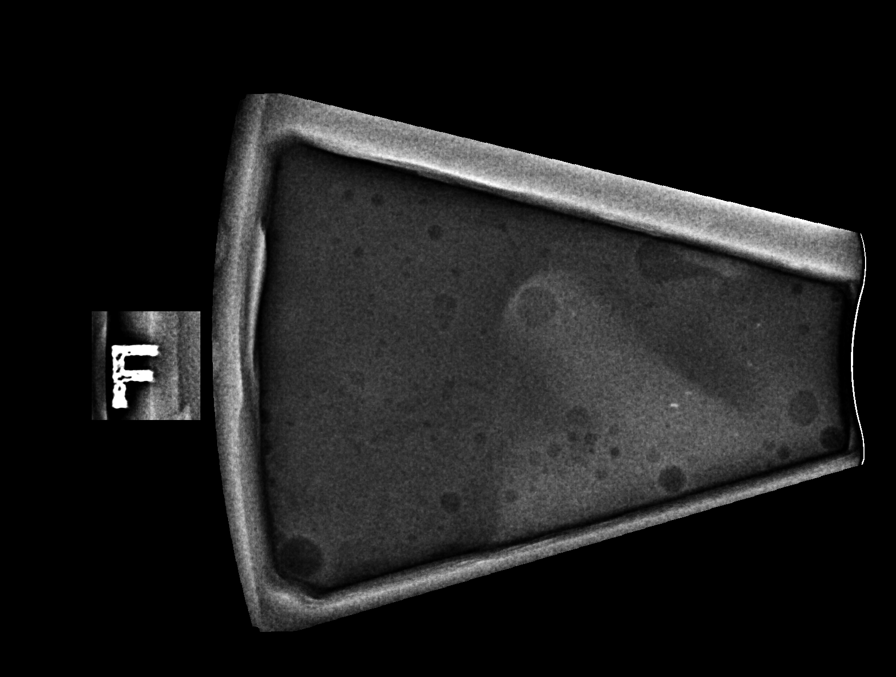

[R (2 of 5)]
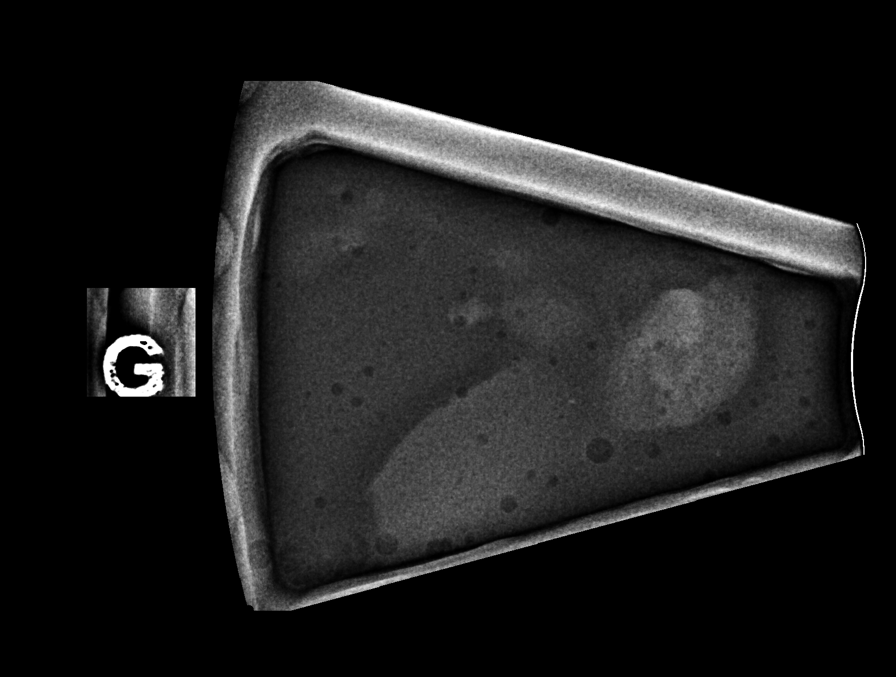

[R (3 of 5)]
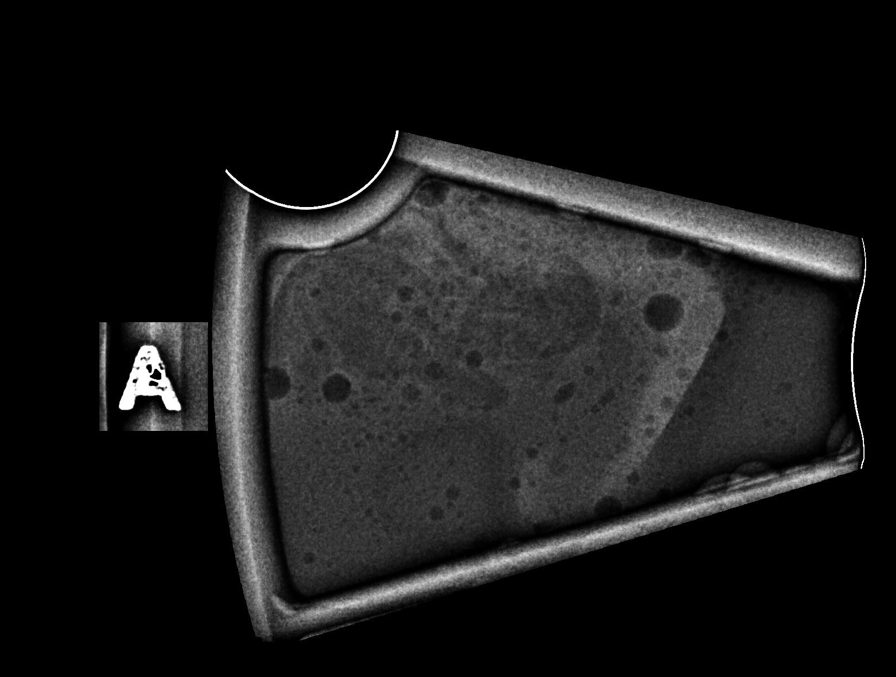

[R (4 of 5)]
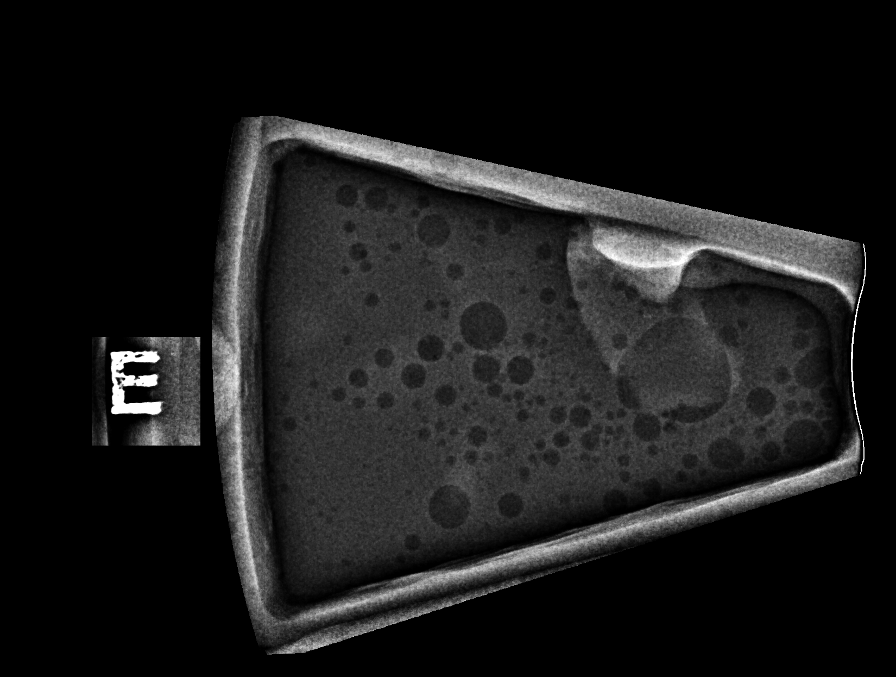

[R (5 of 5)]
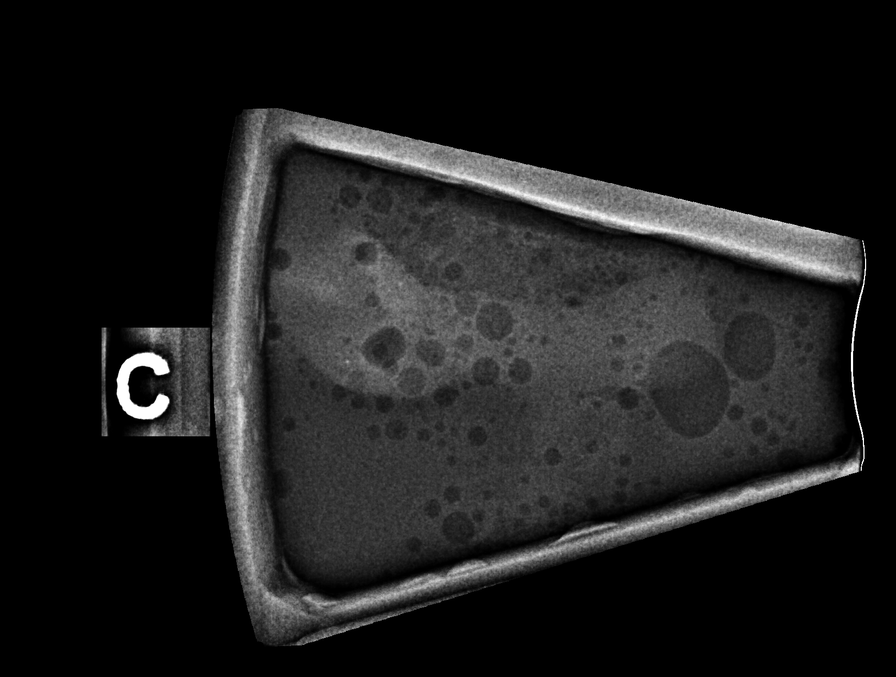

[R LM (1 of 2)]
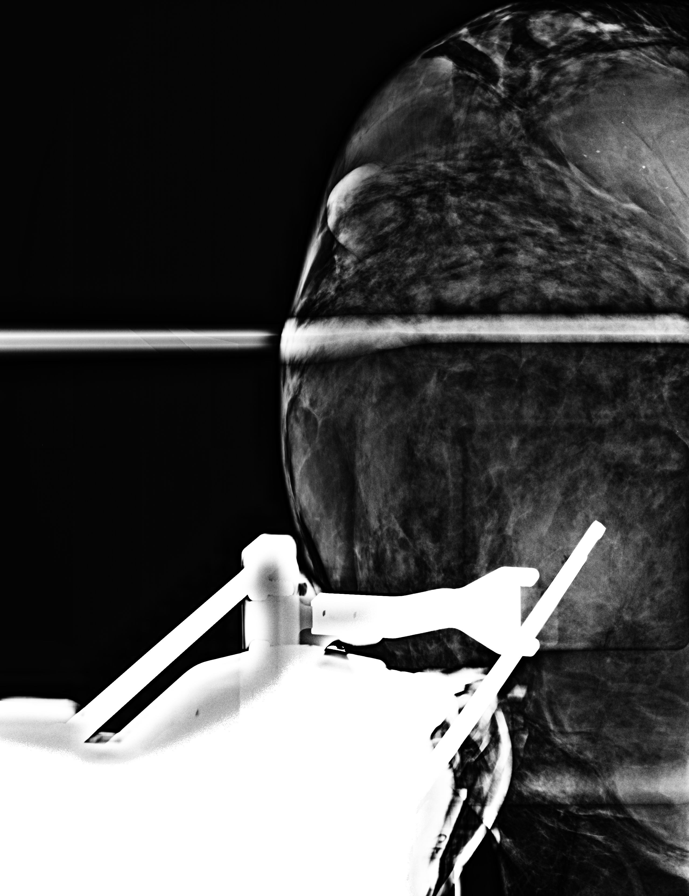

[R LM (2 of 2)]
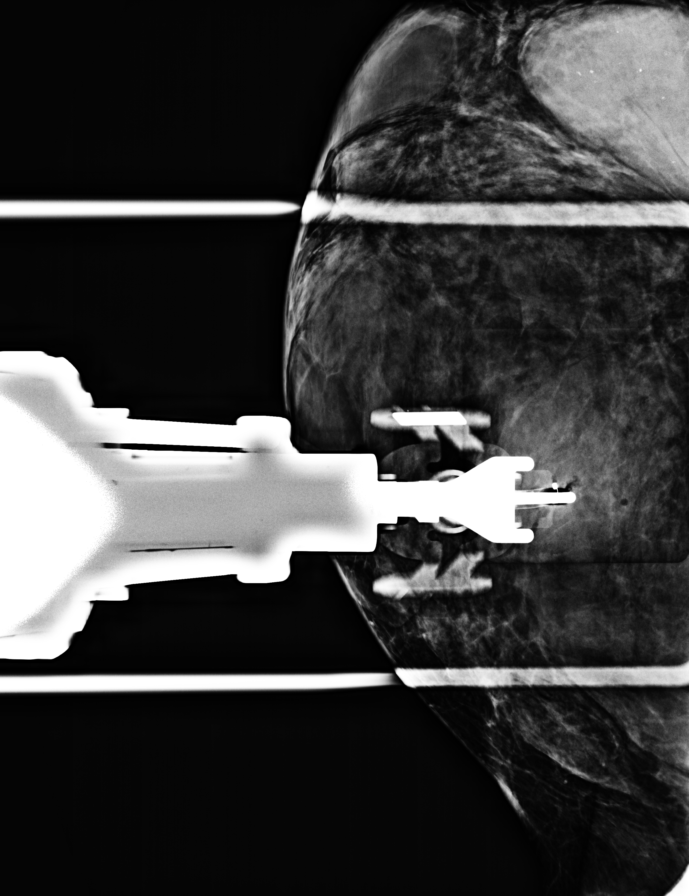

[7 of 40 positions shown; findings below may reference images not displayed]

FINDINGS: The patient and I discussed the procedure of stereotactic-guided
biopsy including benefits and alternatives. We discussed the high
likelihood of successful procedures. We discussed the risks of the
procedures including infection, bleeding, tissue injury, clip
migration, and inadequate sampling. Informed written consent was
given. The usual time out protocol was performed immediately prior
to the procedures.

SITE 1: 7 MM GROUP OF INDETERMINATE CALCIFICATIONS IN THE
UPPER-OUTER QUADRANT OF THE RIGHT BREAST

Using sterile technique and 1% Lidocaine as local anesthetic, under
stereotactic guidance, a 9 gauge vacuum assisted device was used to
perform core needle biopsy of the 7 mm group of indeterminate
calcifications in the upper-outer quadrant of the right breast using
a lateral approach. Specimen radiograph was performed showing
multiple calcifications in multiple specimens. Specimens with
calcifications are identified for pathology.

Lesion quadrant: Upper outer quadrant

At the conclusion of the procedure, a coil shaped tissue marker clip
was deployed into the biopsy cavity. Follow-up 2-view mammogram was
performed and dictated separately.

SITE 2: 6 MM GROUP OF INDETERMINATE CALCIFICATIONS IN THE
UPPER-OUTER QUADRANT OF THE LEFT BREAST

Using sterile technique and 1% Lidocaine as local anesthetic, under
stereotactic guidance, a 9 gauge vacuum assisted device was used to
perform core needle biopsy of the 6 mm group of indeterminate
calcifications in the upper-outer quadrant of the left breast using
a cephalad approach. Specimen radiograph was performed showing
multiple calcifications in 2 specimens. Specimens with
calcifications are identified for pathology.

Lesion quadrant: Upper outer quadrant

At the conclusion of the procedure, a coil shaped tissue marker clip
was deployed into the biopsy cavity. Follow-up 2-view mammogram was
performed and dictated separately.
IMPRESSION: Stereotactic-guided biopsy of bilateral breast calcifications. No
apparent complications.

ADDENDUM:
Pathology revealed BENIGN BREAST WITH EXTENSIVE STROMAL FIBROSIS,
MICROCALCIFICATION PRESENT WITHIN BENIGN ADENOSIS AND DUCTS of the
RIGHT breast, upper outer, (coil clip). This was found to be
concordant by Dr. Jhanca Niebles.

Pathology revealed BENIGN BREAST SHOWING EXTENSIVE STROMAL FIBROSIS
WITH FOCAL FIBROMATOID CHANGE, MICROCALCIFICATIONS PRESENT WITHIN
BENIGN DUCTS of the LEFT breast, upper outer, (coil clip). This was
found to be concordant by Dr. Jhanca Niebles.

Pathology results were discussed with the patient by telephone. The
patient reported doing well after the biopsies with tenderness at
the sites. Post biopsy instructions and care were reviewed and
questions were answered. The patient was encouraged to call The
direct phone number was provided.

The patient was instructed to return for BILATERAL diagnostic
mammography in 6 months and informed a reminder notice would be sent
regarding this appointment.

Pathology results reported by Pretty Lyu, RN on 02/03/2022.

*** End of Addendum ***
FINDINGS: The patient and I discussed the procedure of stereotactic-guided
biopsy including benefits and alternatives. We discussed the high
likelihood of successful procedures. We discussed the risks of the
procedures including infection, bleeding, tissue injury, clip
migration, and inadequate sampling. Informed written consent was
given. The usual time out protocol was performed immediately prior
to the procedures.

SITE 1: 7 MM GROUP OF INDETERMINATE CALCIFICATIONS IN THE
UPPER-OUTER QUADRANT OF THE RIGHT BREAST

Using sterile technique and 1% Lidocaine as local anesthetic, under
stereotactic guidance, a 9 gauge vacuum assisted device was used to
perform core needle biopsy of the 7 mm group of indeterminate
calcifications in the upper-outer quadrant of the right breast using
a lateral approach. Specimen radiograph was performed showing
multiple calcifications in multiple specimens. Specimens with
calcifications are identified for pathology.

Lesion quadrant: Upper outer quadrant

At the conclusion of the procedure, a coil shaped tissue marker clip
was deployed into the biopsy cavity. Follow-up 2-view mammogram was
performed and dictated separately.

SITE 2: 6 MM GROUP OF INDETERMINATE CALCIFICATIONS IN THE
UPPER-OUTER QUADRANT OF THE LEFT BREAST

Using sterile technique and 1% Lidocaine as local anesthetic, under
stereotactic guidance, a 9 gauge vacuum assisted device was used to
perform core needle biopsy of the 6 mm group of indeterminate
calcifications in the upper-outer quadrant of the left breast using
a cephalad approach. Specimen radiograph was performed showing
multiple calcifications in 2 specimens. Specimens with
calcifications are identified for pathology.

Lesion quadrant: Upper outer quadrant

At the conclusion of the procedure, a coil shaped tissue marker clip
was deployed into the biopsy cavity. Follow-up 2-view mammogram was
performed and dictated separately.
IMPRESSION: Stereotactic-guided biopsy of bilateral breast calcifications. No
apparent complications.

## 2022-11-09 ENCOUNTER — Ambulatory Visit: Payer: Managed Care, Other (non HMO) | Admitting: Neurology

## 2022-11-09 ENCOUNTER — Ambulatory Visit
Admission: RE | Admit: 2022-11-09 | Discharge: 2022-11-09 | Disposition: A | Discharge status: Home / Self Care | Payer: Managed Care, Other (non HMO) | Source: Ambulatory Visit | Attending: Neurology | Admitting: Neurology

## 2022-11-09 ENCOUNTER — Encounter: Payer: Self-pay | Admitting: Neurology

## 2022-11-09 ENCOUNTER — Telehealth: Payer: Self-pay | Admitting: Neurology

## 2022-11-09 VITALS — BP 117/75 | HR 80 | Ht 65.0 in | Wt 150.2 lb

## 2022-11-09 DIAGNOSIS — M5442 Lumbago with sciatica, left side: Secondary | ICD-10-CM | POA: Diagnosis not present

## 2022-11-09 DIAGNOSIS — R200 Anesthesia of skin: Secondary | ICD-10-CM

## 2022-11-09 DIAGNOSIS — M25552 Pain in left hip: Secondary | ICD-10-CM

## 2022-11-09 DIAGNOSIS — R1030 Lower abdominal pain, unspecified: Secondary | ICD-10-CM

## 2022-11-09 DIAGNOSIS — G8929 Other chronic pain: Secondary | ICD-10-CM

## 2022-11-09 DIAGNOSIS — M5431 Sciatica, right side: Secondary | ICD-10-CM | POA: Diagnosis not present

## 2022-11-09 DIAGNOSIS — R29898 Other symptoms and signs involving the musculoskeletal system: Secondary | ICD-10-CM

## 2022-11-09 DIAGNOSIS — M25551 Pain in right hip: Secondary | ICD-10-CM

## 2022-11-09 MED ORDER — GABAPENTIN 300 MG PO CAPS: ORAL_CAPSULE | ORAL | 11 refills | Status: AC

## 2022-11-09 NOTE — Telephone Encounter (Signed)
[employer] sent to [facility] they obtain auth [id number]

## 2022-11-09 NOTE — Patient Instructions (Addendum)
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  - repeat MRi lumbar spine and send to injections had them before and worked - XR hips -gabapentin for pain  Gabapentin for pain MRI lumbar spine - will call you XR of hips - walk in [address] no preath [address]ork needed  L5 - s1 radic [facility]  Meds ordered this encounter  Medications   gabapentin (NEURONTIN) 300 MG capsule    Sig: 2-3 times daily for pain. At night can take 2 tabs before bed    Dispense:  120 capsule    Refill:  11   Orders Placed This Encounter  Procedures   DG HIPS BILAT WITH PELVIS 3-4 VIEWS   MR LUMBAR SPINE WO CONTRAST   Ambulatory referral to Pain Clinic  Sciatica  Sciatica is pain, numbness, weakness, or tingling along the path of the sciatic nerve. The sciatic nerve starts in the lower back and runs down the back of each leg. The nerve controls the muscles in the lower leg and in the back of the knee. It also provides feeling (sensation) to the back of the thigh, the lower leg, and the sole of the foot. Sciatica is a symptom of another medical condition that pinches or puts pressure on the sciatic nerve. Sciatica most often only affects one side of the body. Sciatica usually goes away on its own or with treatment. In some cases, sciatica may come back (recur). What are the causes? This condition is caused by pressure on the sciatic nerve or pinching of the nerve. This may be the result of: A disk in between the bones of the spine bulging out too far (herniated disk). Age-related changes in the spinal disks. A pain disorder that affects a muscle in the buttock. Extra bone growth near the sciatic nerve. A break (fracture) of the pelvis. Pregnancy. Tumor. This is rare. What increases the risk? The following factors may  make you more likely to develop this condition: Playing sports that place pressure or stress on the spine. Having poor strength and flexibility. A history of back injury or surgery. Sitting for long periods of time. Doing activities that involve repetitive bending or lifting. Obesity. What are the signs or symptoms? Symptoms can vary from mild to very severe. They may include: Any of the following problems in the lower back, leg, hip, or buttock: Mild tingling, numbness, or dull aches. Burning sensations. Sharp pains. Numbness in the back of the calf or the sole of the foot. Leg weakness. Severe back pain that makes movement difficult. Symptoms may get worse when you cough, sneeze, or laugh, or when you sit or stand for long periods of time. How is this diagnosed? This condition may be diagnosed based on: Your symptoms and medical history. A physical exam. Blood tests. Imaging tests, such as: X-rays. An MRI. A CT scan. How is this treated? In many cases, this condition improves on its own without treatment. However, treatment may include: Reducing or modifying physical activity. Exercising, including strengthening and stretching. Icing and applying heat to the affected area. Medicines that help to: Relieve pain and swelling. Relax your muscles. Injections of medicines that help to relieve pain and inflammation (steroids) around the sciatic nerve. Surgery. Follow these instructions at home: Medicines Take over-the-counter and prescription medicines only as told by your health care provider. Ask your health  care provider if the medicine prescribed to you requires you to avoid driving or using heavy machinery. Managing pain     If directed, put ice on the affected area. To do this: Put ice in a plastic bag. Place a towel between your skin and the bag. Leave the ice on for 20 minutes, 2-3 times a day. If your skin turns bright red, remove the ice right away to prevent skin  damage. The risk of skin damage is higher if you cannot feel pain, heat, or cold. If directed, apply heat to the affected area as often as told by your health care provider. Use the heat source that your health care provider recommends, such as a moist heat pack or a heating pad. Place a towel between your skin and the heat source. Leave the heat on for 20-30 minutes. If your skin turns bright red, remove the heat right away to prevent burns. The risk of burns is higher if you cannot feel pain, heat, or cold. Activity  Return to your normal activities as told by your health care provider. Ask your health care provider what activities are safe for you. Avoid activities that make your symptoms worse. Take brief periods of rest throughout the day. When you rest for longer periods, mix in some mild activity or stretching between periods of rest. This will help to prevent stiffness and pain. Avoid sitting for long periods of time without moving. Get up and move around at least one time each hour. Exercise and stretch regularly as told by your health care provider. Do not lift anything that is heavier than 10 lb (4.5 kg) until your health care provider says that it is safe. When you do not have symptoms, you should still avoid heavy lifting, especially repetitive heavy lifting. When you lift objects, always use proper lifting technique, which includes: Bending your knees. Keeping the load close to your body. Avoiding twisting. General instructions Maintain a healthy weight. Excess weight puts extra stress on your back. Wear supportive, comfortable shoes. Avoid wearing high heels. Avoid sleeping on a mattress that is too soft or too hard. A mattress that is firm enough to support your back when you sleep may help to reduce your pain. Contact a health care provider if: Your pain is not controlled by medicine. Your pain does not improve or gets worse. Your pain lasts longer than 4 weeks. You have  unexplained weight loss. Get help right away if: You are not able to control when you urinate or have bowel movements (incontinence). You have: Weakness in your lower back, pelvis, buttocks, or legs that gets worse. Redness or swelling of your back. A burning sensation when you urinate. Summary Sciatica is pain, numbness, weakness, or tingling along the path of the sciatic nerve, which may include the lower back, legs, hips, and buttocks. This condition is caused by pressure on the sciatic nerve or pinching of the nerve. Treatment often includes rest, exercise, medicines, and applying ice or heat. This information is not intended to replace advice given to you by your health care provider. Make sure you discuss any questions you have with your health care provider. Document Revised: 12/01/2021 Document Reviewed: 12/01/2021 Elsevier Patient Education  [place].      Gabapentin Capsules or Tablets What is this medication? GABAPENTIN (GA ba pen tin) treats nerve pain. It may also be used to prevent and control seizures in people with epilepsy. It works by calming overactive nerves in your body. This  medicine may be used for other purposes; ask your health care provider or pharmacist if you have questions. COMMON BRAND NAME(S): Active-PAC with Gabapentin, Orpha Bur, Gralise, Neurontin What should I tell my care team before I take this medication? They need to know if you have any of these conditions: Alcohol or substance use disorder Kidney disease Lung or breathing disease Suicidal thoughts, plans, or attempt; a previous suicide attempt by you or a family member An unusual or allergic reaction to gabapentin, other medications, foods, dyes, or preservatives Pregnant or trying to get pregnant Breast-feeding How should I use this medication? Take this medication by mouth with a glass of water. Follow the directions on the prescription label. You can take it with or without food.  If it upsets your stomach, take it with food. Take your medication at regular intervals. Do not take it more often than directed. Do not stop taking except on your care team's advice. If you are directed to break the 600 or 800 mg tablets in half as part of your dose, the extra half tablet should be used for the next dose. If you have not used the extra half tablet within 28 days, it should be thrown away. A special MedGuide will be given to you by the pharmacist with each prescription and refill. Be sure to read this information carefully each time. Talk to your care team about the use of this medication in children. While this medication may be prescribed for children as young as 3 years for selected conditions, precautions do apply. Overdosage: If you think you have taken too much of this medicine contact a poison control center or emergency room at once. NOTE: This medicine is only for you. Do not share this medicine with others. What if I miss a dose? If you miss a dose, take it as soon as you can. If it is almost time for your next dose, take only that dose. Do not take double or extra doses. What may interact with this medication? Alcohol Antihistamines for allergy, cough, and cold Certain medications for anxiety or sleep Certain medications for depression like amitriptyline, fluoxetine, sertraline Certain medications for seizures like phenobarbital, primidone Certain medications for stomach problems General anesthetics like halothane, isoflurane, methoxyflurane, propofol Local anesthetics like lidocaine, pramoxine, tetracaine Medications that relax muscles for surgery Opioid medications for pain Phenothiazines like chlorpromazine, mesoridazine, prochlorperazine, thioridazine This list may not describe all possible interactions. Give your health care provider a list of all the medicines, herbs, non-prescription drugs, or dietary supplements you use. Also tell them if you smoke, drink  alcohol, or use illegal drugs. Some items may interact with your medicine. What should I watch for while using this medication? Visit your care team for regular checks on your progress. You may want to keep a record at home of how you feel your condition is responding to treatment. You may want to share this information with your care team at each visit. You should contact your care team if your seizures get worse or if you have any new types of seizures. Do not stop taking this medication or any of your seizure medications unless instructed by your care team. Stopping your medication suddenly can increase your seizures or their severity. This medication may cause serious skin reactions. They can happen weeks to months after starting the medication. Contact your care team right away if you notice fevers or flu-like symptoms with a rash. The rash may be red or purple and then turn into blisters  or peeling of the skin. Or, you might notice a red rash with swelling of the face, lips or lymph nodes in your neck or under your arms. Wear a medical identification bracelet or chain if you are taking this medication for seizures. Carry a card that lists all your medications. This medication may affect your coordination, reaction time, or judgment. Do not drive or operate machinery until you know how this medication affects you. Sit up or stand slowly to reduce the risk of dizzy or fainting spells. Drinking alcohol with this medication can increase the risk of these side effects. Your mouth may get dry. Chewing sugarless gum or sucking hard candy, and drinking plenty of water may help. Watch for new or worsening thoughts of suicide or depression. This includes sudden changes in mood, behaviors, or thoughts. These changes can happen at any time but are more common in the beginning of treatment or after a change in dose. Call your care team right away if you experience these thoughts or worsening depression. If you  become pregnant while using this medication, you may enroll in the [place] Pregnancy Registry by calling [phone]. This registry collects information about the safety of antiepileptic medication use during pregnancy. What side effects may I notice from receiving this medication? Side effects that you should report to your care team as soon as possible: Allergic reactions or angioedema--skin rash, itching, hives, swelling of the face, eyes, lips, tongue, arms, or legs, trouble swallowing or breathing Rash, fever, and swollen lymph nodes Thoughts of suicide or self harm, worsening mood, feelings of depression Trouble breathing Unusual changes in mood or behavior in children after use such as difficulty concentrating, hostility, or restlessness Side effects that usually do not require medical attention (report to your care team if they continue or are bothersome): Dizziness Drowsiness Nausea Swelling of ankles, feet, or hands Vomiting This list may not describe all possible side effects. Call your doctor for medical advice about side effects. You may report side effects to FDA at 1-800-FDA-1088. Where should I keep my medication? Keep out of reach of children and pets. Store at room temperature between 15 and 30 degrees C (59 and 86 degrees F). Get rid of any unused medication after the expiration date. This medication may cause accidental overdose and death if taken by other adults, children, or pets. To get rid of medications that are no longer needed or have expired: Take the medication to a medication take-back program. Check with your pharmacy or law enforcement to find a location. If you cannot return the medication, check the label or package insert to see if the medication should be thrown out in the garbage or flushed down the toilet. If you are not sure, ask your care team. If it is safe to put it in the trash, empty the medication out of the container. Mix  the medication with cat litter, dirt, coffee grounds, or other unwanted substance. Seal the mixture in a bag or container. Put it in the trash. NOTE: This sheet is a summary. It may not cover all possible information. If you have questions about this medicine, talk to your doctor, pharmacist, or health care provider.  2023 Elsevier/Gold Standard (2021-02-24 00:00:00)

## 2022-11-09 NOTE — Progress Notes (Signed)
[facility]    Provider:  Dr Jaynee Eagles Requesting Provider: Kristen Loader, FNP Primary Care Provider:  Cari Caraway, MD  CC:  Leg pain  HPI:  Suzanne Anderson is a 57 y.o. female here as requested by Kristen Loader, FNP for Back and leg pain. Patient was seen a few years ago MRI clearly showed nerve  L5 impoingement,she needs injections or surgery.   Low back pain, shooting pain down legs, all the way to the feet, numbness in the legs, burning, burning in the foot. Also groin numbness and pain radiating in toe groin. Pain in the hips. Pain in the low back. She has been under the care of PT, going on for years, went to physical therapy, she does exercises at home, worsening, weakness in legs. Bending over makes it better, standing straigh makes it worse and walking nd standing, bending over a cart in the store makes it better. Back pain and sciatica ins continues and severe at times. Also some hip pain radiating into the froin will check xrays of groin No other focal neurologic deficits, associated symptoms, inciting events or modifiable factors.    Reviewed notes, labs and imaging from outside physicians, which showed:     Latest Ref Rng & Units 04/01/2022    4:38 PM 11/02/2019    8:00 AM 05/04/2019    2:55 PM  CBC  WBC 3.8 - 10.8 Thousand/uL 4.1  3.1    Hemoglobin 11.7 - 15.5 g/dL 13.2  13.5    Hematocrit 35.0 - 45.0 % 40.9  41.3  39.8   Platelets 140 - 400 Thousand/uL 260  260         Latest Ref Rng & Units 04/01/2022    4:38 PM 05/03/2014   10:47 AM 11/17/2013    5:00 AM  CMP  Glucose 65 - 99 mg/dL 71   112   BUN 7 - 25 mg/dL 21   14   Creatinine 0.50 - 1.03 mg/dL 0.73   0.88   Sodium 135 - 146 mmol/L 137   136   Potassium 3.5 - 5.3 mmol/L 4.0   4.5   Chloride 98 - 110 mmol/L 100   99   CO2 20 - 32 mmol/L 26   23   Calcium 8.6 - 10.4 mg/dL 10.0   9.8   Total Protein 6.1 - 8.1 g/dL 7.3  7.4    Total Bilirubin 0.2 - 1.2 mg/dL 0.4     AST 10 - 35 U/L  23     ALT 6 - 29 U/L 21       Had successful injections, reviewed images: Narrative & Impression  CLINICAL DATA:  Lumbosacral spondylosis without myelopathy   FLUOROSCOPY TIME:  Sixteen seconds.   PROCEDURE: LUMBAR EPIDURAL   The procedure, risks, benefits, and alternatives were explained to the patient. Questions regarding the procedure were encouraged and answered. The patient understands and consents to the procedure.   LUMBAR EPIDURAL INJECTION: An interlaminar approach was performed on the left at L4-5. The overlying skin was cleansed and anesthetized. A 20 gauge Crawford epidural needle was advanced using loss-of-resistance technique.   DIAGNOSTIC EPIDURAL INJECTION: Injection of Omnipaque 180 shows a good epidural pattern with spread above and below the level of needle placement, primarily on the left. No vascular opacification is seen.   THERAPEUTIC EPIDURAL INJECTION: 120 mg of Depo-Medrol mixed with 4 cc 1% lidocaine were instilled. The procedure was well-tolerated, and the patient was discharged thirty minutes following  the injection in good condition.   COMPLICATIONS: None   IMPRESSION: Technically successful epidural injection on the left at L4-5.     Electronically Signed   By: Maryclare Bean M.D.   On: 08/29/2014 11:03     On axial views: L1-2: no spinal stenosis or foraminal narrowing L2-3: facet hypertrophy with no spinal stenosis or foraminal narrowing L3-4: facet hypertrophy with no spinal stenosis or foraminal narrowing L4-5: posterior central disc protrusion, facet hypertrophy, with mild-moderate right and moderate left foraminal stenosis, right and left lateral recess stenosis with potential impingement upon the descending L5 nerve roots. L5-S1: facet hypertrophy with mild biforaminal narrowing  Limited views of the aorta, kidneys, iliopsoas muscles and sacroiliac joints are notable for sacro-iliac joint arthropathy.     Impression   Abnormal MRI lumbar spine (without) demonstrating: 1. At L4-5: posterior central disc protrusion, facet hypertrophy, with mild-moderate right and moderate left foraminal stenosis, right and left lateral recess stenosis with potential impingement upon the descending L5 nerve roots. 2. At L5-S1: facet hypertrophy with mild biforaminal narrowing     Review of Systems: Patient complains of symptoms per HPI as well as the following symptoms back pain. Pertinent negatives and positives per HPI. All others negative.   Social History   Socioeconomic History   Marital status: Widowed    Spouse name: Not on file   Number of children: 2   Years of education: Associate   Highest education level: Not on file  Occupational History   Occupation: Stay at home mom  Tobacco Use   Smoking status: Never   Smokeless tobacco: Never  Vaping Use   Vaping Use: Never used  Substance and Sexual Activity   Alcohol use: No   Drug use: No   Sexual activity: Not Currently    Partners: Male    Birth control/protection: Surgical    Comment: tubal ligation  Other Topics Concern   Not on file  Social History Narrative   Patient is widowed with 2 children.   Patient is right handed.   Patient has an Associate's degree.   Patient does not drink any caffeine.   Social Determinants of Health   Financial Resource Strain: Not on file  Food Insecurity: Not on file  Transportation Needs: Not on file  Physical Activity: Not on file  Stress: Not on file  Social Connections: Not on file  Intimate Partner Violence: Not on file    Family History  Problem Relation Age of Onset   Arthritis/Rheumatoid Mother    Heart murmur Sister    Heart murmur Maternal Aunt    Heart murmur Maternal Aunt    Diabetes Maternal Grandmother    Hypertension Maternal Grandmother    Stroke Maternal Grandmother    Migraines Maternal Grandmother    Heart attack Paternal Grandfather    Heart murmur Daughter     Past  Medical History:  Diagnosis Date   Allergic rhinitis, cause unspecified    Chest pain, unspecified    Dysmenorrhea    Fibrocystic breast    Hyperthyroidism    reported as borderline by Dr. 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   Dysfunction of left eustachian tube 05/21/2017   Ear pressure, left 05/21/2017   Peripheral neuropathy 05/06/2014   Chronic migraine 05/06/2014   Lumbar radicular pain 05/06/2014   Allergic rhinitis 01/18/2009   CHEST PAIN 01/16/2009    Past Surgical History:  Procedure Laterality Date   BREAST BIOPSY Left 10/11/2009   for fibrocystic breast times 2   BREAST EXCISIONAL BIOPSY Left 2003   benign   BREAST EXCISIONAL BIOPSY Right 1993   benign   CESAREAN SECTION     times 2   TONSILLECTOMY     TUBAL LIGATION      Current Outpatient Medications  Medication Sig Dispense Refill   albuterol (VENTOLIN HFA) 108 (90 Base) MCG/ACT inhaler Inhale 2 puffs into the lungs as needed.     Ascorbic Acid (VITAMIN C) 100 MG tablet Take 100 mg by mouth daily.     cyanocobalamin 100 MCG tablet Take 100 mcg by mouth daily.     gabapentin (NEURONTIN) 300 MG capsule 2-3 times daily for pain. At night can take 2 tabs before bed 120 capsule 11   montelukast (SINGULAIR) 10 MG tablet Take 10 mg by mouth as needed.     Multiple Vitamin (CALCIUM COMPLEX PO) Take by mouth.     Multiple Vitamins-Minerals (CENTRUM ADULTS PO) Take by mouth.     VITAMIN D PO Take by mouth.     No current facility-administered medications for this visit.    Allergies as of 11/09/2022 - Review  Complete 11/09/2022  Allergen Reaction Noted   Flexeril [cyclobenzaprine]  02/20/2014   Peanuts [peanut oil] Nausea And Vomiting 11/18/2013   Terazol [terconazole] Swelling    Vicodin [hydrocodone-acetaminophen]  02/20/2014   Monistat [miconazole] Rash     Vitals: BP 117/75   Pulse 80   Ht '5\' 5"'$  (1.651 m)   Wt 150 lb 3.2 oz (68.1 kg)   BMI 24.99 kg/m  Last Weight:  Wt Readings from Last 1 Encounters:  11/09/22 150 lb 3.2 oz (68.1 kg)   Last Height:   Ht Readings from Last 1 Encounters:  11/09/22 '5\' 5"'$  (1.651 m)     Physical exam: Exam: Gen: NAD, conversant, well nourised, obese, well groomed                     CV: RRR, no MRG. No Carotid Bruits. No peripheral edema, warm, nontender Eyes: Conjunctivae clear without exudates or hemorrhage  Neuro: Detailed Neurologic Exam  Speech:    Speech is normal; fluent and spontaneous with normal comprehension.  Cognition:    The patient is oriented to person, place, and time;     recent and remote memory intact;     language fluent;     normal attention, concentration,     fund of knowledge Cranial Nerves:    The pupils are equal, round, and reactive to light. The fundi are normal and spontaneous venous pulsations are present. Visual fields are full to finger confrontation. Extraocular movements are intact. Trigeminal sensation is intact and the muscles of mastication are normal. The face is symmetric. The palate elevates in the midline. Hearing intact. Voice is normal. Shoulder shrug is normal. The tongue has normal motion without fasciculations.   Coordination:    Normal finger to nose and heel to shin. Normal rapid alternating movements.   Gait:    Heel-toe and tandem gait are normal.   Motor Observation: nml Tone:    Normal muscle  tone.    Posture:    Posture is normal. normal erect    Strength: mild weakness left leg extension.     Strength is V/V in the upper and lower limbs.      Sensation: decreased l5/s2  distribution lower legs left > right      Reflex Exam:  DTR's:    Deep tendon reflexes in the upper and lower extremities are normal bilaterally.  She has bilateral Ajs. Toes:    The toes are downgoing bilaterally.   Clonus:    Clonus is absent.    Assessment/Plan:  Suzanne Anderson is a 58 y.o. female here as requested by Kristen Loader, FNP for Back and leg pain. Patient was seen a few years ago MRI clearly showed nerve  L5 impoingement,she needs injections or surgery.  Consistent exam left leg > right and leg weakness and umbness in l5/s1 distribution. Clearly feels better to bend over and worse walking uphill, better on a shopping cart, may need surgical internvention, will repeat MRI l spine from 2015 that clarly shoed L5/s1 impingement. EVEN SNEEZING CAUSED SEVERE RADICULAR PAIN needs injections and then possibly surgery, saw Dr. Kathyrn Sheriff and really liked him as well.   Low back pain, shooting pain down legs, all the way to the feet, numbness in the legs, burning, burning in the foot. Also groin numbness and pain radiating in groin. Pain in the hips. Pain in the low back. She has been under the care of PT, going on for years, worsening went to physical therapy, she does exercises at home, worsening, weakness in legs. No other focal neurologic deficits, associated symptoms, inciting events or modifiable factors. 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  - repeat MRi lumbar spine and send to injections had them before and worked - XR hips -gabapentin for pain  Gabapentin for pain MRI lumbar spine - will call you XR of hips - walk in [address] no preath paperowork needed  L5 - s1 radic SCIATICA  Meds ordered this encounter  Medications   gabapentin (NEURONTIN) 300 MG capsule     Sig: 2-3 times daily for pain. At night can take 2 tabs before bed    Dispense:  120 capsule    Refill:  11   Orders Placed This Encounter  Procedures   DG HIPS BILAT WITH PELVIS 3-4 VIEWS   MR LUMBAR SPINE WO CONTRAST   Ambulatory referral to Pain Clinic   L4/L5 from 2015    Cc: Kristen Loader, FNP,  Cari Caraway, MD  Sarina Ill, MD  [facility] [address] [place], [place] [address]  Phone [phone] Fax [phone]   I spent more that 60 minutes minutes of face-to-face and non-face-to-face time with patient on the  1. Bilateral sciatica   2. Chronic bilateral low back pain with bilateral sciatica   3. Weakness of both legs   4. Saddle anesthesia   5. Bilateral hip pain   6. Inguinal pain, unspecified laterality    diagnosis.  This included previsit chart review, lab review, study review, order entry, electronic health record documentation, patient education on the different diagnostic and therapeutic options, counseling and coordination of care, risks and benefits of management, compliance, or risk factor reduction

## 2022-11-10 ENCOUNTER — Telehealth: Payer: Self-pay | Admitting: Neurology

## 2022-11-10 NOTE — Telephone Encounter (Signed)
Referral to pain clinic fax to [facility]. Phone: [phone],  Fax: [phone].

## 2022-12-05 ENCOUNTER — Other Ambulatory Visit: Payer: Managed Care, Other (non HMO)

## 2022-12-17 ENCOUNTER — Other Ambulatory Visit (HOSPITAL_COMMUNITY): Payer: Self-pay | Admitting: Family Medicine

## 2022-12-17 DIAGNOSIS — R1084 Generalized abdominal pain: Secondary | ICD-10-CM

## 2022-12-17 DIAGNOSIS — Z6825 Body mass index (BMI) 25.0-25.9, adult: Secondary | ICD-10-CM | POA: Diagnosis not present

## 2022-12-22 ENCOUNTER — Ambulatory Visit (HOSPITAL_COMMUNITY)
Admission: RE | Admit: 2022-12-22 | Discharge: 2022-12-22 | Disposition: A | Discharge status: Home / Self Care | Payer: BC Managed Care – PPO | Source: Ambulatory Visit | Attending: Family Medicine | Admitting: Family Medicine

## 2022-12-22 DIAGNOSIS — R1084 Generalized abdominal pain: Secondary | ICD-10-CM | POA: Insufficient documentation

## 2022-12-22 MED ORDER — IOHEXOL 300 MG/ML  SOLN
75.0000 mL | Freq: Once | INTRAMUSCULAR | Status: AC | PRN
  Administered 2022-12-22: 75 mL via INTRAVENOUS

## 2022-12-22 MED ORDER — SODIUM CHLORIDE (PF) 0.9 % IJ SOLN
INTRAMUSCULAR | Status: AC
  Filled 2022-12-22: qty 50

## 2023-01-04 ENCOUNTER — Telehealth: Payer: Self-pay | Admitting: Neurology

## 2023-01-04 ENCOUNTER — Encounter: Payer: Self-pay | Admitting: Neurology

## 2023-01-04 ENCOUNTER — Telehealth (INDEPENDENT_AMBULATORY_CARE_PROVIDER_SITE_OTHER): Payer: BC Managed Care – PPO | Admitting: Neurology

## 2023-01-04 DIAGNOSIS — M5417 Radiculopathy, lumbosacral region: Secondary | ICD-10-CM | POA: Diagnosis not present

## 2023-01-04 NOTE — Progress Notes (Signed)
[facility]    Provider:  Dr Lucia Gaskins Requesting Provider: Gweneth Dimitri, MD Primary Care Provider:  Gweneth Dimitri, MD  Virtual Visit via Video Note  I connected with Suzanne Anderson on 01/04/23 at  8:30 AM EDT by a video enabled telemedicine application and verified that I am speaking with the correct person using two identifiers.  Location: Patient: home Provider: office   I discussed the limitations of evaluation and management by telemedicine and the availability of in person appointments. The patient expressed understanding and agreed to proceed.  History of Present Illness:    Follow Up Instructions:    I discussed the assessment and treatment plan with the patient. The patient was provided an opportunity to ask questions and all were answered. The patient agreed with the plan and demonstrated an understanding of the instructions.   The patient was advised to call back or seek an in-person evaluation if the symptoms worsen or if the condition fails to improve as anticipated.  I provided 30 minutes of non-face-to-face time during this encounter.   Anson Fret, MD   CC:  Leg pain  01/04/2023:Last time she was here she clearly had L5 impingement and pain in the hip. Te xray did not show any hip problems. The MRI lumbar spine was declined was is now approved. Back is feeling better with exercises and she is no longer standing and working. Currently she has cobra so can't afford a new MRI Lumbar spine. But MRI in 2015 clearly showed L% bilateral impingement. Will send to [facility] for left >>> right sciatica L5 radiculopathy from 2015. She does not want surgery, the shots in the past really helped. Will send to [facility] for left L5 and possibly right L5 injections. If this does not work will have to [facility] new MRI lumbar spine.   On axial views: 2015 L1-2: no spinal stenosis or foraminal narrowing  L2-3: facet hypertrophy with  no spinal stenosis or foraminal narrowing  L3-4: facet hypertrophy with no spinal stenosis or foraminal narrowing  L4-5: posterior central disc protrusion, facet hypertrophy, with  mild-moderate right and moderate left foraminal stenosis, right and left  lateral recess stenosis with potential impingement upon the descending L5  nerve roots.  L5-S1: facet hypertrophy with mild biforaminal narrowing    Patient complains of symptoms per HPI as well as the following symptoms: back pain . Pertinent negatives and positives per HPI. All others negative   08/19/2014 Had injections at [facility] see below will resend, worked great for NINE years will reoreder  Narrative & Impression  CLINICAL DATA:  Lumbosacral spondylosis without myelopathy   FLUOROSCOPY TIME:  Sixteen seconds.   PROCEDURE: LUMBAR EPIDURAL   The procedure, risks, benefits, and alternatives were explained to the patient. Questions regarding the procedure were encouraged and answered. The patient understands and consents to the procedure.   LUMBAR EPIDURAL INJECTION: An interlaminar approach was performed on the left at L4-5. The overlying skin was cleansed and anesthetized. A 20 gauge Crawford epidural needle was advanced using loss-of-resistance technique.   DIAGNOSTIC EPIDURAL INJECTION: Injection of Omnipaque 180 shows a good epidural pattern with spread above and below the level of needle placement, primarily on the left. No vascular opacification is seen.   THERAPEUTIC EPIDURAL INJECTION: 120 mg of Depo-Medrol mixed with 4 cc 1% lidocaine were instilled. The procedure was well-tolerated, and the patient was discharged thirty minutes following the injection in good condition.   COMPLICATIONS: None   IMPRESSION: Technically  successful epidural injection on the left at L4-5.     Electronically Signed   By: Maryclare Bean M.D.   On: 08/29/2014 11:03    CLINICAL DATA:  Chronic bilateral hip pain.    EXAM: DG HIP (WITH OR WITHOUT PELVIS) 3-4V BILAT   COMPARISON:  None Available.   FINDINGS: There is no evidence of hip fracture or dislocation. There is no evidence of arthropathy or other focal bone abnormality.   IMPRESSION: Negative.     Electronically Signed   By: Lupita Raider M.D.   On: 11/09/2022 16:15   HPI:  Suzanne Anderson is a 57 y.o. female here as requested by Soundra Pilon, FNP for Back and leg pain. Patient was seen a few years ago MRI clearly showed nerve  L5 impoingement,she needs injections or surgery.   Low back pain, shooting pain down legs, all the way to the feet, numbness in the legs, burning, burning in the foot. Also groin numbness and pain radiating in toe groin. Pain in the hips. Pain in the low back. She has been under the care of PT, going on for years, went to physical therapy, she does exercises at home, worsening, weakness in legs. Bending over makes it better, standing straigh makes it worse and walking nd standing, bending over a cart in the store makes it better. Back pain and sciatica ins continues and severe at times. Also some hip pain radiating into the froin will check xrays of groin No other focal neurologic deficits, associated symptoms, inciting events or modifiable factors.    Reviewed notes, labs and imaging from outside physicians, which showed:     Latest Ref Rng & Units 04/01/2022    4:38 PM 11/02/2019    8:00 AM 05/04/2019    2:55 PM  CBC  WBC 3.8 - 10.8 Thousand/uL 4.1  3.1    Hemoglobin 11.7 - 15.5 g/dL 60.4  54.0    Hematocrit 35.0 - 45.0 % 40.9  41.3  39.8   Platelets 140 - 400 Thousand/uL 260  260         Latest Ref Rng & Units 04/01/2022    4:38 PM 05/03/2014   10:47 AM 11/17/2013    5:00 AM  CMP  Glucose 65 - 99 mg/dL 71   981   BUN 7 - 25 mg/dL 21   14   Creatinine 1.91 - 1.03 mg/dL 4.78   2.95   Sodium 621 - 146 mmol/L 137   136   Potassium 3.5 - 5.3 mmol/L 4.0   4.5   Chloride 98 - 110 mmol/L 100   99    CO2 20 - 32 mmol/L 26   23   Calcium 8.6 - 10.4 mg/dL 30.8   9.8   Total Protein 6.1 - 8.1 g/dL 7.3  7.4    Total Bilirubin 0.2 - 1.2 mg/dL 0.4     AST 10 - 35 U/L 23     ALT 6 - 29 U/L 21       Had successful injections, reviewed images: Narrative & Impression  CLINICAL DATA:  Lumbosacral spondylosis without myelopathy   FLUOROSCOPY TIME:  Sixteen seconds.   PROCEDURE: LUMBAR EPIDURAL   The procedure, risks, benefits, and alternatives were explained to the patient. Questions regarding the procedure were encouraged and answered. The patient understands and consents to the procedure.   LUMBAR EPIDURAL INJECTION: An interlaminar approach was performed on the left at L4-5. The overlying skin was cleansed and anesthetized.  A 20 gauge Crawford epidural needle was advanced using loss-of-resistance technique.   DIAGNOSTIC EPIDURAL INJECTION: Injection of Omnipaque 180 shows a good epidural pattern with spread above and below the level of needle placement, primarily on the left. No vascular opacification is seen.   THERAPEUTIC EPIDURAL INJECTION: 120 mg of Depo-Medrol mixed with 4 cc 1% lidocaine were instilled. The procedure was well-tolerated, and the patient was discharged thirty minutes following the injection in good condition.   COMPLICATIONS: None   IMPRESSION: Technically successful epidural injection on the left at L4-5.     Electronically Signed   By: Maryclare Bean M.D.   On: 08/29/2014 11:03     On axial views: L1-2: no spinal stenosis or foraminal narrowing L2-3: facet hypertrophy with no spinal stenosis or foraminal narrowing L3-4: facet hypertrophy with no spinal stenosis or foraminal narrowing L4-5: posterior central disc protrusion, facet hypertrophy, with mild-moderate right and moderate left foraminal stenosis, right and left lateral recess stenosis with potential impingement upon the descending L5 nerve roots. L5-S1: facet hypertrophy with mild  biforaminal narrowing  Limited views of the aorta, kidneys, iliopsoas muscles and sacroiliac joints are notable for sacro-iliac joint arthropathy.     Impression  Abnormal MRI lumbar spine (without) demonstrating: 1. At L4-5: posterior central disc protrusion, facet hypertrophy, with mild-moderate right and moderate left foraminal stenosis, right and left lateral recess stenosis with potential impingement upon the descending L5 nerve roots. 2. At L5-S1: facet hypertrophy with mild biforaminal narrowing     Review of Systems: Patient complains of symptoms per HPI as well as the following symptoms back pain. Pertinent negatives and positives per HPI. All others negative.   Social History   Socioeconomic History   Marital status: Widowed    Spouse name: Not on file   Number of children: 2   Years of education: Associate   Highest education level: Not on file  Occupational History   Occupation: Stay at home mom  Tobacco Use   Smoking status: Never   Smokeless tobacco: Never  Vaping Use   Vaping Use: Never used  Substance and Sexual Activity   Alcohol use: No   Drug use: No   Sexual activity: Not Currently    Partners: Male    Birth control/protection: Surgical    Comment: tubal ligation  Other Topics Concern   Not on file  Social History Narrative   Patient is widowed with 2 children.   Patient is right handed.   Patient has an Associate's degree.   Patient does not drink any caffeine.   Social Determinants of Health   Financial Resource Strain: Not on file  Food Insecurity: Not on file  Transportation Needs: Not on file  Physical Activity: Not on file  Stress: Not on file  Social Connections: Not on file  Intimate Partner Violence: Not on file    Family History  Problem Relation Age of Onset   Arthritis/Rheumatoid Mother    Heart murmur Sister    Heart murmur Maternal Aunt    Heart murmur Maternal Aunt    Diabetes Maternal Grandmother    Hypertension  Maternal Grandmother    Stroke Maternal Grandmother    Migraines Maternal Grandmother    Heart attack Paternal Grandfather    Heart murmur Daughter     Past Medical History:  Diagnosis Date   Allergic rhinitis, cause unspecified    Chest pain, unspecified    Dysmenorrhea    Fibrocystic breast    Hyperthyroidism    reported  as borderline by Dr. 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   Dysfunction of left eustachian tube 05/21/2017   Ear pressure, left 05/21/2017   Peripheral neuropathy 05/06/2014   Chronic migraine 05/06/2014   Lumbar radicular pain 05/06/2014   Allergic rhinitis 01/18/2009   CHEST PAIN 01/16/2009    Past Surgical History:  Procedure Laterality Date   BREAST BIOPSY Left 10/11/2009   for fibrocystic breast times 2   BREAST EXCISIONAL BIOPSY Left 2003   benign   BREAST EXCISIONAL BIOPSY Right 1993   benign   CESAREAN SECTION     times 2   TONSILLECTOMY     TUBAL LIGATION      Current Outpatient Medications  Medication Sig Dispense Refill   albuterol (VENTOLIN HFA) 108 (90 Base) MCG/ACT inhaler Inhale 2 puffs into the lungs as needed.     Ascorbic Acid (VITAMIN C) 100 MG tablet Take 100 mg by mouth daily.     cyanocobalamin 100 MCG tablet Take 100 mcg by mouth daily.     gabapentin (NEURONTIN) 300 MG capsule 2-3 times daily for pain. At night can take 2 tabs before bed 120 capsule 11   montelukast (SINGULAIR) 10 MG tablet Take 10 mg by mouth as needed.     Multiple Vitamin (CALCIUM COMPLEX PO) Take by mouth.      Multiple Vitamins-Minerals (CENTRUM ADULTS PO) Take by mouth.     VITAMIN D PO Take by mouth.     No current facility-administered medications for this visit.    Allergies as of 01/04/2023 - Review Complete 01/04/2023  Allergen Reaction Noted   Flexeril [cyclobenzaprine]  02/20/2014   Peanuts [peanut oil] Nausea And Vomiting 11/18/2013   Terazol [terconazole] Swelling    Vicodin [hydrocodone-acetaminophen]  02/20/2014   Monistat [miconazole] Rash     Vitals: There were no vitals taken for this visit. Last Weight:  Wt Readings from Last 1 Encounters:  11/09/22 150 lb 3.2 oz (68.1 kg)   Last Height:   Ht Readings from Last 1 Encounters:  11/09/22 5\' 5"  (1.651 m)    Physical exam: Exam: Gen: NAD, conversant      CV:  Denies palpitations or chest pain or SOB. VS: Breathing at a normal rate. Weight appears within normal limits. Not febrile. Eyes: Conjunctivae clear without exudates or hemorrhage  Neuro: Detailed Neurologic Exam  Speech:    Speech is normal; fluent and spontaneous with normal comprehension.  Cognition:    The patient is oriented to person, place, and time;     recent and remote memory intact;     language fluent;     normal attention, concentration,     fund of knowledge Cranial Nerves:    The pupils are equal, round, and reactive to light.  Cannot perform fundoscopic exam. Visual fields are full to finger confrontation. Extraocular movements are intact.  The face is symmetric with normal sensation. The palate elevates in the midline. Hearing intact. Voice is normal. Shoulder shrug is normal. The tongue has normal motion without fasciculations.   Coordination:  Normal finger to nose  Gait:    Normal native gait  Motor Observation:   no involuntary movements noted. Tone:    Appears normal  Posture:    Posture is normal. normal erect    Strength:    Strength is anti-gravity and symmetric in the upper and lower limbs.      Sensation: intact  to LT       Assessment/Plan:  Suzanne Anderson is a 57 y.o. female here as requested by Soundra Pilon, FNP for Back and leg pain. Patient was seen a few years ago MRI clearly showed nerve  L5 impoingement,she needs injections or surgery.  Consistent exam left leg > right and leg weakness and umbness in l5/s1 distribution. Clearly feels better to bend over and worse walking uphill, better on a shopping cart, may need surgical internvention, will repeat MRI l spine from 2015 that clarly shoed L5/s1 impingement. EVEN SNEEZING CAUSED SEVERE RADICULAR PAIN needs injections and then possibly surgery, saw Dr. Conchita Paris and really liked him as well.   L4-5: posterior central disc protrusion, facet hypertrophy, with  mild-moderate right and moderate left foraminal stenosis, right and left  lateral recess stenosis with potential impingement upon the descending L5  nerve roots.   Would like injections:  Orders Placed This Encounter  Procedures   DG INJECT DIAG/THERA/INC NEEDLE/CATH/PLC EPI/LUMB/SAC W/IMG    Cc: Gweneth Dimitri, MD,  Gweneth Dimitri, MD  Naomie Dean, MD  [facility] [address] [place], [place] [address]  Phone [phone] Fax [phone]

## 2023-01-04 NOTE — Telephone Encounter (Signed)
Sent info as well as MD note to [place] at [facility] for scheduling.

## 2023-01-04 NOTE — Telephone Encounter (Signed)
Please call [facility] and ask if they could get Suzanne Anderson in for [facility] or evaluate and treat as necessary. She has L5 radiculopathy known and last injection in 2015 at [facility] last 9 years, let's see if we cn get her in (prefer [facility] but don't care every physician there is great)

## 2023-01-05 ENCOUNTER — Ambulatory Visit (HOSPITAL_BASED_OUTPATIENT_CLINIC_OR_DEPARTMENT_OTHER): Payer: Managed Care, Other (non HMO)

## 2023-01-08 ENCOUNTER — Ambulatory Visit
Admission: RE | Admit: 2023-01-08 | Discharge: 2023-01-08 | Disposition: A | Discharge status: Home / Self Care | Payer: Self-pay | Source: Ambulatory Visit | Attending: Family Medicine | Admitting: Family Medicine

## 2023-01-08 DIAGNOSIS — Z1231 Encounter for screening mammogram for malignant neoplasm of breast: Secondary | ICD-10-CM | POA: Diagnosis not present

## 2023-01-08 DIAGNOSIS — Z9889 Other specified postprocedural states: Secondary | ICD-10-CM

## 2023-01-14 ENCOUNTER — Ambulatory Visit
Admission: RE | Admit: 2023-01-14 | Discharge: 2023-01-14 | Disposition: A | Discharge status: Home / Self Care | Payer: BC Managed Care – PPO | Source: Ambulatory Visit | Attending: Neurology | Admitting: Neurology

## 2023-01-14 DIAGNOSIS — M5417 Radiculopathy, lumbosacral region: Secondary | ICD-10-CM

## 2023-01-14 DIAGNOSIS — M5431 Sciatica, right side: Secondary | ICD-10-CM | POA: Diagnosis not present

## 2023-01-14 DIAGNOSIS — M47816 Spondylosis without myelopathy or radiculopathy, lumbar region: Secondary | ICD-10-CM | POA: Diagnosis not present

## 2023-01-14 MED ORDER — IOPAMIDOL (ISOVUE-M 200) INJECTION 41%
1.0000 mL | Freq: Once | INTRAMUSCULAR | Status: AC
  Administered 2023-01-14: 1 mL via EPIDURAL

## 2023-01-14 MED ORDER — METHYLPREDNISOLONE ACETATE 40 MG/ML INJ SUSP (RADIOLOG
80.0000 mg | Freq: Once | INTRAMUSCULAR | Status: AC
  Administered 2023-01-14: 80 mg via EPIDURAL

## 2023-01-14 NOTE — Discharge Instructions (Signed)

## 2023-02-03 DIAGNOSIS — D72819 Decreased white blood cell count, unspecified: Secondary | ICD-10-CM | POA: Diagnosis not present

## 2023-02-03 DIAGNOSIS — E0590 Thyrotoxicosis, unspecified without thyrotoxic crisis or storm: Secondary | ICD-10-CM | POA: Diagnosis not present

## 2023-02-03 DIAGNOSIS — R7301 Impaired fasting glucose: Secondary | ICD-10-CM | POA: Diagnosis not present

## 2023-02-08 DIAGNOSIS — E0590 Thyrotoxicosis, unspecified without thyrotoxic crisis or storm: Secondary | ICD-10-CM | POA: Diagnosis not present

## 2023-02-08 DIAGNOSIS — G609 Hereditary and idiopathic neuropathy, unspecified: Secondary | ICD-10-CM | POA: Diagnosis not present

## 2023-02-08 DIAGNOSIS — R7301 Impaired fasting glucose: Secondary | ICD-10-CM | POA: Diagnosis not present

## 2023-02-08 DIAGNOSIS — D72819 Decreased white blood cell count, unspecified: Secondary | ICD-10-CM | POA: Diagnosis not present

## 2023-04-05 ENCOUNTER — Encounter: Payer: Self-pay | Admitting: Nurse Practitioner

## 2023-04-05 ENCOUNTER — Ambulatory Visit: Payer: Managed Care, Other (non HMO) | Admitting: Obstetrics and Gynecology

## 2023-04-05 ENCOUNTER — Other Ambulatory Visit (HOSPITAL_COMMUNITY)
Admission: RE | Admit: 2023-04-05 | Discharge: 2023-04-05 | Disposition: A | Discharge status: Home / Self Care | Payer: BC Managed Care – PPO | Source: Ambulatory Visit | Attending: Nurse Practitioner | Admitting: Nurse Practitioner

## 2023-04-05 ENCOUNTER — Ambulatory Visit (INDEPENDENT_AMBULATORY_CARE_PROVIDER_SITE_OTHER): Payer: BC Managed Care – PPO | Admitting: Nurse Practitioner

## 2023-04-05 VITALS — BP 118/64 | HR 76 | Ht 64.0 in | Wt 153.0 lb

## 2023-04-05 DIAGNOSIS — Z124 Encounter for screening for malignant neoplasm of cervix: Secondary | ICD-10-CM

## 2023-04-05 DIAGNOSIS — N952 Postmenopausal atrophic vaginitis: Secondary | ICD-10-CM | POA: Diagnosis not present

## 2023-04-05 DIAGNOSIS — Z01419 Encounter for gynecological examination (general) (routine) without abnormal findings: Secondary | ICD-10-CM

## 2023-04-05 DIAGNOSIS — N941 Unspecified dyspareunia: Secondary | ICD-10-CM

## 2023-04-05 MED ORDER — ESTRADIOL 0.1 MG/GM VA CREA
1.0000 g | TOPICAL_CREAM | Freq: Every day | VAGINAL | 2 refills | Status: DC
Start: 2023-04-05 — End: 2023-04-05

## 2023-04-05 MED ORDER — PREMARIN 0.625 MG/GM VA CREA
1.0000 g | TOPICAL_CREAM | VAGINAL | 2 refills | Status: DC
Start: 2023-04-05 — End: 2024-04-17

## 2023-04-05 NOTE — Progress Notes (Signed)
   Suzanne Anderson 1966-06-12 [id number]   History:  57 y.o. [id number] presents for annual exam. Postmenopausal - no HRT. LMP March 2023. Complains of severe hot flashes and night sweats, painful intercourse. Newly sexually active recently and unable to continue due to pain/burning. Normal pap history.   Gynecologic History Patient's last menstrual period was 11/12/2021.   Contraception/Family planning: post menopausal status and tubal ligation Sexually active: Yes  Health Maintenance Last Pap: 04/27/2018. Results were: Normal neg HPV, 5-year repeat Last mammogram: 01/08/2023. Results were: Normal Last colonoscopy: 2024. 5-year recall Last Dexa: Not indicated  Past medical history, past surgical history, family history and social history were all reviewed and documented in the EPIC chart. Widowed. Boyfriend of 2 years. Retired in March. 2 daughters, one at [employer] and one at [employer].   ROS:  A ROS was performed and pertinent positives and negatives are included.  Exam:  Vitals:   04/05/23 1404  BP: 118/64  Pulse: 76  SpO2: 100%  Weight: 153 lb (69.4 kg)  Height: 5\' 4"  (1.626 m)   Body mass index is 26.26 kg/m.  General appearance:  Normal Thyroid:  Symmetrical, normal in size, without palpable masses or nodularity. Respiratory  Auscultation:  Clear without wheezing or rhonchi Cardiovascular  Auscultation:  Regular rate, without rubs, murmurs or gallops  Edema/varicosities:  Not grossly evident Abdominal  Soft,nontender, without masses, guarding or rebound.  Liver/spleen:  No organomegaly noted  Hernia:  None appreciated  Skin  Inspection:  Grossly normal Breasts: Examined lying and sitting. Multiple smooth, mobile lumps of varying sizes in both breasts c/w fibrocystic breast disease. Has been followed for years, multiple biopsies.  Genitourinary   Inguinal/mons:  Normal without inguinal adenopathy  External genitalia:  Normal appearing vulva with no masses, tenderness, or  lesions  BUS/Urethra/Skene's glands:  Normal  Vagina:  Normal appearing with normal color and discharge, no lesions. Atrophic changes.   Cervix:  Normal appearing without discharge or lesions  Uterus:  Normal in size, shape and contour.  Midline and mobile, nontender  Adnexa/parametria:     Rt: Normal in size, without masses or tenderness.   Lt: Normal in size, without masses or tenderness.  Anus and perineum: Normal  Digital rectal exam: Deferred  Patient informed chaperone available to be present for breast and pelvic exam. Patient has requested no chaperone to be present. Patient has been advised what will be completed during breast and pelvic exam.   Assessment/Plan:  57 y.o. [id number] for annual exam.   Well female exam with routine gynecological exam - Education provided on SBEs, importance of preventative screenings, current guidelines, high calcium diet, regular exercise, and multivitamin daily.  Labs with endocrinology.   Screening for cervical cancer - Plan: Cytology - PAP( [facility]). Normal pap history.   Dyspareunia, female - Plan: conjugated estrogens (PREMARIN) vaginal cream twice weekly. Also recommend oil or silicone lubricant.   Vaginal atrophy - Plan: conjugated estrogens (PREMARIN) vaginal cream twice weekly. Initially will use nightly x 2 weeks.   Screening for breast cancer - Normal mammogram history.  Continue annual screenings.  Normal breast exam today. Very fibrocystic breast.   Screening for colon cancer - 2024 colonoscopy. Will repeat at 5-year interval per GI's recommendation.   Screening for osteoporosis - Average risk. Will plan DXA at age 47.   Return in 1 year for annual or sooner if needed.     Olivia Mackie DNP, 2:35 PM 04/05/2023

## 2023-07-22 DIAGNOSIS — Z13220 Encounter for screening for lipoid disorders: Secondary | ICD-10-CM | POA: Diagnosis not present

## 2023-07-22 DIAGNOSIS — Z23 Encounter for immunization: Secondary | ICD-10-CM | POA: Diagnosis not present

## 2023-07-22 DIAGNOSIS — Z1159 Encounter for screening for other viral diseases: Secondary | ICD-10-CM | POA: Diagnosis not present

## 2023-07-22 DIAGNOSIS — Z0000 Encounter for general adult medical examination without abnormal findings: Secondary | ICD-10-CM | POA: Diagnosis not present

## 2023-07-22 DIAGNOSIS — R7303 Prediabetes: Secondary | ICD-10-CM | POA: Diagnosis not present

## 2023-07-22 DIAGNOSIS — E559 Vitamin D deficiency, unspecified: Secondary | ICD-10-CM | POA: Diagnosis not present

## 2023-11-02 DIAGNOSIS — M25561 Pain in right knee: Secondary | ICD-10-CM | POA: Diagnosis not present

## 2023-11-02 DIAGNOSIS — R040 Epistaxis: Secondary | ICD-10-CM | POA: Diagnosis not present

## 2023-11-02 DIAGNOSIS — M25562 Pain in left knee: Secondary | ICD-10-CM | POA: Diagnosis not present

## 2023-11-02 DIAGNOSIS — Z6824 Body mass index (BMI) 24.0-24.9, adult: Secondary | ICD-10-CM | POA: Diagnosis not present

## 2023-12-08 ENCOUNTER — Other Ambulatory Visit: Payer: Self-pay | Admitting: Family Medicine

## 2023-12-08 DIAGNOSIS — N6039 Fibrosclerosis of unspecified breast: Secondary | ICD-10-CM

## 2024-01-15 ENCOUNTER — Encounter (HOSPITAL_COMMUNITY): Payer: Self-pay

## 2024-01-21 DIAGNOSIS — M25561 Pain in right knee: Secondary | ICD-10-CM | POA: Diagnosis not present

## 2024-01-22 ENCOUNTER — Ambulatory Visit
Admission: RE | Admit: 2024-01-22 | Discharge: 2024-01-22 | Disposition: A | Discharge status: Home / Self Care | Source: Ambulatory Visit | Attending: Family Medicine | Admitting: Family Medicine

## 2024-01-22 DIAGNOSIS — N6022 Fibroadenosis of left breast: Secondary | ICD-10-CM | POA: Diagnosis not present

## 2024-01-22 DIAGNOSIS — N6021 Fibroadenosis of right breast: Secondary | ICD-10-CM | POA: Diagnosis not present

## 2024-01-22 DIAGNOSIS — N6039 Fibrosclerosis of unspecified breast: Secondary | ICD-10-CM

## 2024-01-24 DIAGNOSIS — R7303 Prediabetes: Secondary | ICD-10-CM | POA: Diagnosis not present

## 2024-01-27 ENCOUNTER — Encounter

## 2024-01-28 DIAGNOSIS — M25561 Pain in right knee: Secondary | ICD-10-CM | POA: Diagnosis not present

## 2024-01-28 DIAGNOSIS — M25562 Pain in left knee: Secondary | ICD-10-CM | POA: Diagnosis not present

## 2024-02-04 DIAGNOSIS — M25561 Pain in right knee: Secondary | ICD-10-CM | POA: Diagnosis not present

## 2024-02-04 DIAGNOSIS — M25562 Pain in left knee: Secondary | ICD-10-CM | POA: Diagnosis not present

## 2024-02-09 DIAGNOSIS — R7303 Prediabetes: Secondary | ICD-10-CM | POA: Diagnosis not present

## 2024-02-09 DIAGNOSIS — Z713 Dietary counseling and surveillance: Secondary | ICD-10-CM | POA: Diagnosis not present

## 2024-02-11 DIAGNOSIS — E0590 Thyrotoxicosis, unspecified without thyrotoxic crisis or storm: Secondary | ICD-10-CM | POA: Diagnosis not present

## 2024-02-11 DIAGNOSIS — D72819 Decreased white blood cell count, unspecified: Secondary | ICD-10-CM | POA: Diagnosis not present

## 2024-02-11 DIAGNOSIS — R7301 Impaired fasting glucose: Secondary | ICD-10-CM | POA: Diagnosis not present

## 2024-02-11 DIAGNOSIS — M25561 Pain in right knee: Secondary | ICD-10-CM | POA: Diagnosis not present

## 2024-02-14 DIAGNOSIS — D72819 Decreased white blood cell count, unspecified: Secondary | ICD-10-CM | POA: Diagnosis not present

## 2024-02-14 DIAGNOSIS — E0590 Thyrotoxicosis, unspecified without thyrotoxic crisis or storm: Secondary | ICD-10-CM | POA: Diagnosis not present

## 2024-02-14 DIAGNOSIS — G609 Hereditary and idiopathic neuropathy, unspecified: Secondary | ICD-10-CM | POA: Diagnosis not present

## 2024-02-14 DIAGNOSIS — R7301 Impaired fasting glucose: Secondary | ICD-10-CM | POA: Diagnosis not present

## 2024-02-25 DIAGNOSIS — M25561 Pain in right knee: Secondary | ICD-10-CM | POA: Diagnosis not present

## 2024-02-25 DIAGNOSIS — M25562 Pain in left knee: Secondary | ICD-10-CM | POA: Diagnosis not present

## 2024-03-03 DIAGNOSIS — M25561 Pain in right knee: Secondary | ICD-10-CM | POA: Diagnosis not present

## 2024-03-03 DIAGNOSIS — M25562 Pain in left knee: Secondary | ICD-10-CM | POA: Diagnosis not present

## 2024-03-17 DIAGNOSIS — M25561 Pain in right knee: Secondary | ICD-10-CM | POA: Diagnosis not present

## 2024-03-24 ENCOUNTER — Encounter: Payer: Self-pay | Admitting: Advanced Practice Midwife

## 2024-03-31 DIAGNOSIS — M25562 Pain in left knee: Secondary | ICD-10-CM | POA: Diagnosis not present

## 2024-03-31 DIAGNOSIS — M25561 Pain in right knee: Secondary | ICD-10-CM | POA: Diagnosis not present

## 2024-04-04 DIAGNOSIS — R051 Acute cough: Secondary | ICD-10-CM | POA: Diagnosis not present

## 2024-04-04 DIAGNOSIS — J209 Acute bronchitis, unspecified: Secondary | ICD-10-CM | POA: Diagnosis not present

## 2024-04-06 ENCOUNTER — Ambulatory Visit: Payer: BC Managed Care – PPO | Admitting: Nurse Practitioner

## 2024-04-13 ENCOUNTER — Ambulatory Visit: Payer: BC Managed Care – PPO | Admitting: Nurse Practitioner

## 2024-04-13 NOTE — Progress Notes (Deleted)
   Suzanne Anderson 12-Mar-1966 [id number]   History:  58 y.o. [id number] presents for annual exam. Postmenopausal - no systemic HRT. Using vaginal premarin  for dryness and painful intercourse.  Normal pap history.   Gynecologic History Patient's last menstrual period was 11/12/2021.   Contraception/Family planning: post menopausal status and tubal ligation Sexually active: Yes  Health Maintenance Last Pap: 04/05/2023. Results were: Normal neg HPV Last mammogram: 01/22/2024. Results were: Normal Last colonoscopy: 2024. 5-year recall Last Dexa: Not indicated  Past medical history, past surgical history, family history and social history were all reviewed and documented in the EPIC chart. Widowed. Boyfriend of 2 years. Retired in March. 2 daughters, one at [employer] and one at [employer].   ROS:  A ROS was performed and pertinent positives and negatives are included.  Exam:  There were no vitals filed for this visit.  There is no height or weight on file to calculate BMI.  General appearance:  Normal Thyroid :  Symmetrical, normal in size, without palpable masses or nodularity. Respiratory  Auscultation:  Clear without wheezing or rhonchi Cardiovascular  Auscultation:  Regular rate, without rubs, murmurs or gallops  Edema/varicosities:  Not grossly evident Abdominal  Soft,nontender, without masses, guarding or rebound.  Liver/spleen:  No organomegaly noted  Hernia:  None appreciated  Skin  Inspection:  Grossly normal Breasts: Examined lying and sitting. Multiple smooth, mobile lumps of varying sizes in both breasts c/w fibrocystic breast disease. Has been followed for years, multiple biopsies.  Genitourinary   Inguinal/mons:  Normal without inguinal adenopathy  External genitalia:  Normal appearing vulva with no masses, tenderness, or lesions  BUS/Urethra/Skene's glands:  Normal  Vagina:  Normal appearing with normal color and discharge, no lesions. Atrophic changes.   Cervix:  Normal  appearing without discharge or lesions  Uterus:  Normal in size, shape and contour.  Midline and mobile, nontender  Adnexa/parametria:     Rt: Normal in size, without masses or tenderness.   Lt: Normal in size, without masses or tenderness.  Anus and perineum: Normal  Digital rectal exam: Deferred  Patient informed chaperone available to be present for breast and pelvic exam. Patient has requested no chaperone to be present. Patient has been advised what will be completed during breast and pelvic exam.   Assessment/Plan:  58 y.o. [id number] for annual exam.   Well female exam with routine gynecological exam - Education provided on SBEs, importance of preventative screenings, current guidelines, high calcium diet, regular exercise, and multivitamin daily.  Labs with endocrinology.   Dyspareunia, female - Plan: conjugated estrogens  (PREMARIN ) vaginal cream twice weekly. Also recommend oil or silicone lubricant.   Vaginal atrophy - Plan: conjugated estrogens  (PREMARIN ) vaginal cream twice weekly. Initially will use nightly x 2 weeks.   Screening for cervical cancer - Normal pap history. Will repeat at 5-year interval per guidelines.   Screening for breast cancer - Normal mammogram history.  Continue annual screenings.  Normal breast exam today. Very fibrocystic breast.   Screening for colon cancer - 2024 colonoscopy. Will repeat at 5-year interval per GI's recommendation.   Screening for osteoporosis - Average risk. Will plan DXA at age 48.   No follow-ups on file.    Suzanne DELENA Shutter DNP, 9:40 AM 04/13/2024

## 2024-04-17 ENCOUNTER — Ambulatory Visit (INDEPENDENT_AMBULATORY_CARE_PROVIDER_SITE_OTHER): Admitting: Nurse Practitioner

## 2024-04-17 ENCOUNTER — Encounter: Payer: Self-pay | Admitting: Nurse Practitioner

## 2024-04-17 VITALS — BP 110/70 | HR 68 | Ht 65.5 in | Wt 153.0 lb

## 2024-04-17 DIAGNOSIS — Z01419 Encounter for gynecological examination (general) (routine) without abnormal findings: Secondary | ICD-10-CM

## 2024-04-17 DIAGNOSIS — N941 Unspecified dyspareunia: Secondary | ICD-10-CM

## 2024-04-17 DIAGNOSIS — Z1331 Encounter for screening for depression: Secondary | ICD-10-CM | POA: Diagnosis not present

## 2024-04-17 DIAGNOSIS — N952 Postmenopausal atrophic vaginitis: Secondary | ICD-10-CM

## 2024-04-17 DIAGNOSIS — Z780 Asymptomatic menopausal state: Secondary | ICD-10-CM

## 2024-04-17 MED ORDER — ESTRADIOL 0.1 MG/GM VA CREA
1.0000 g | TOPICAL_CREAM | VAGINAL | 2 refills | Status: AC
Start: 2024-04-17 — End: ?

## 2024-04-17 NOTE — Progress Notes (Signed)
 Suzanne Anderson 06-26-1966 [id number]   History: 58 y.o. [id number] presents for annual exam.  58 y.o. [id number] presents for annual exam. Postmenopausal - no systemic HRT. Complains of vaginal dryness and painful intercourse. Has not been sexually active due to pain. Prescribed premarin  last year but did not start. Interested now. Normal pap history.   Gynecologic History Patient's last menstrual period was 11/12/2021.   Contraception/Family planning: post menopausal status and tubal ligation Sexually active: Yes  Health Maintenance Last Pap: 04/05/2023. Results were: Normal neg HPV Last mammogram: 01/22/2024. Results were: Normal Last colonoscopy: 2024. 5-year recall Last Dexa: Not indicated  Past medical history, past surgical history, family history and social history were all reviewed and documented in the EPIC chart. Widowed. Boyfriend. Retired in March. 2 daughters. One is about to move to [place] for grad school.   ROS:  A ROS was performed and pertinent positives and negatives are included.  Exam:  Vitals:   04/17/24 1552  BP: 110/70  Pulse: 68  SpO2: 99%  Weight: 153 lb (69.4 kg)  Height: 5' 5.5 (1.664 m)    Body mass index is 25.07 kg/m.  General appearance:  Normal Thyroid :  Symmetrical, normal in size, without palpable masses or nodularity. Respiratory  Auscultation:  Clear without wheezing or rhonchi Cardiovascular  Auscultation:  Regular rate, without rubs, murmurs or gallops  Edema/varicosities:  Not grossly evident Abdominal  Soft,nontender, without masses, guarding or rebound.  Liver/spleen:  No organomegaly noted  Hernia:  None appreciated  Skin  Inspection:  Grossly normal Breasts: Examined lying and sitting. Multiple smooth, mobile lumps of varying sizes in both breasts c/w fibrocystic breast disease. Has been followed for years, multiple biopsies.  Pelvic: External genitalia:  no lesions              Urethra:  normal appearing urethra with no masses, tenderness or lesions               Bartholins and Skenes: normal                 Vagina: normal appearing vagina with normal color and discharge, no lesions. Atrophy present              Cervix: no lesions Bimanual Exam:  Uterus:  no masses or tenderness              Adnexa: no mass, fullness, tenderness              Rectovaginal: Deferred              Anus:  normal, no lesions  Dereck Keas, CMA present as chaperone.   Assessment/Plan:  58 y.o. [id number] for annual exam.   Well female exam with routine gynecological exam - Education provided on SBEs, importance of preventative screenings, current guidelines, high calcium diet, regular exercise, and multivitamin daily.  Labs with endocrinology.   Vaginal atrophy - Plan: estradiol  (ESTRACE  VAGINAL) 0.1 MG/GM vaginal cream twice weekly. Use nightly x 2 weeks initially.   Dyspareunia, female - Plan: estradiol  (ESTRACE  VAGINAL) 0.1 MG/GM vaginal cream twice weekly. Also recommend oil or silicone lubricant. Ube Lube samples provided.   Screening for cervical cancer - Normal pap history. Will repeat at 5-year interval per guidelines.   Screening for breast cancer - Normal mammogram history.  Continue annual screenings.  Normal breast exam today. Very fibrocystic breast.   Screening for colon cancer - 2024 colonoscopy. Will repeat at 5-year interval per GI's recommendation.   Screening for osteoporosis - Average risk. Will plan  DXA at age 3.   Return in about 1 year (around 04/17/2025) for Annual.    Suzanne DELENA Shutter DNP, 4:27 PM 04/17/2024

## 2024-07-31 DIAGNOSIS — G43909 Migraine, unspecified, not intractable, without status migrainosus: Secondary | ICD-10-CM | POA: Diagnosis not present

## 2024-07-31 DIAGNOSIS — E7800 Pure hypercholesterolemia, unspecified: Secondary | ICD-10-CM | POA: Diagnosis not present

## 2024-07-31 DIAGNOSIS — M25562 Pain in left knee: Secondary | ICD-10-CM | POA: Diagnosis not present

## 2024-07-31 DIAGNOSIS — Z23 Encounter for immunization: Secondary | ICD-10-CM | POA: Diagnosis not present

## 2024-07-31 DIAGNOSIS — Z0000 Encounter for general adult medical examination without abnormal findings: Secondary | ICD-10-CM | POA: Diagnosis not present

## 2024-07-31 DIAGNOSIS — R7303 Prediabetes: Secondary | ICD-10-CM | POA: Diagnosis not present

## 2024-07-31 DIAGNOSIS — E0590 Thyrotoxicosis, unspecified without thyrotoxic crisis or storm: Secondary | ICD-10-CM | POA: Diagnosis not present

## 2024-07-31 DIAGNOSIS — R718 Other abnormality of red blood cells: Secondary | ICD-10-CM | POA: Diagnosis not present

## 2024-07-31 DIAGNOSIS — J309 Allergic rhinitis, unspecified: Secondary | ICD-10-CM | POA: Diagnosis not present

## 2024-07-31 DIAGNOSIS — Z79899 Other long term (current) drug therapy: Secondary | ICD-10-CM | POA: Diagnosis not present

## 2025-04-24 ENCOUNTER — Ambulatory Visit: Admitting: Nurse Practitioner
